# Patient Record
Sex: Male | Born: 1970 | Race: White | Hispanic: Refuse to answer | Smoking: Never smoker
Health system: Southern US, Community
[De-identification: ages and names within clinical notes are randomized; demographics above are authoritative.]

## PROBLEM LIST (undated history)

## (undated) DIAGNOSIS — E785 Hyperlipidemia, unspecified: Secondary | ICD-10-CM

## (undated) DIAGNOSIS — I10 Essential (primary) hypertension: Secondary | ICD-10-CM

## (undated) HISTORY — DX: Hyperlipidemia, unspecified: E78.5

## (undated) HISTORY — PX: NASAL SEPTUM SURGERY: SHX37

## (undated) HISTORY — DX: Essential (primary) hypertension: I10

---

## 2004-04-20 ENCOUNTER — Emergency Department: Payer: Self-pay | Admitting: Emergency Medicine

## 2012-08-16 ENCOUNTER — Emergency Department: Payer: Self-pay | Admitting: Emergency Medicine

## 2012-08-16 LAB — URINALYSIS, COMPLETE
Blood: NEGATIVE
Glucose,UR: NEGATIVE mg/dL (ref 0–75)
Nitrite: NEGATIVE
Ph: 5 (ref 4.5–8.0)
Protein: NEGATIVE
WBC UR: 2 /HPF (ref 0–5)

## 2012-08-16 LAB — CBC WITH DIFFERENTIAL/PLATELET
Basophil #: 0.1 10*3/uL (ref 0.0–0.1)
Basophil %: 0.9 %
Eosinophil %: 1.1 %
Lymphocyte #: 4.4 10*3/uL — ABNORMAL HIGH (ref 1.0–3.6)
Lymphocyte %: 40.6 %
MCHC: 32.8 g/dL (ref 32.0–36.0)
Monocyte #: 0.6 x10 3/mm (ref 0.2–1.0)
Monocyte %: 5.3 %
Neutrophil #: 5.7 10*3/uL (ref 1.4–6.5)
Platelet: 268 10*3/uL (ref 150–440)
RBC: 5.45 10*6/uL (ref 4.40–5.90)
WBC: 10.9 10*3/uL — ABNORMAL HIGH (ref 3.8–10.6)

## 2012-08-16 LAB — COMPREHENSIVE METABOLIC PANEL
Albumin: 4.1 g/dL (ref 3.4–5.0)
Alkaline Phosphatase: 88 U/L (ref 50–136)
Anion Gap: 15 (ref 7–16)
BUN: 15 mg/dL (ref 7–18)
Bilirubin,Total: 0.4 mg/dL (ref 0.2–1.0)
Chloride: 101 mmol/L (ref 98–107)
Creatinine: 1.24 mg/dL (ref 0.60–1.30)
EGFR (African American): >60
Potassium: 3.3 mmol/L — ABNORMAL LOW (ref 3.5–5.1)
SGOT(AST): 31 U/L (ref 15–37)
SGPT (ALT): 38 U/L (ref 12–78)
Sodium: 137 mmol/L (ref 136–145)
Total Protein: 8.1 g/dL (ref 6.4–8.2)

## 2012-08-16 LAB — DRUG SCREEN, URINE
Amphetamines, Ur Screen: NEGATIVE (ref ?–1000)
Barbiturates, Ur Screen: NEGATIVE (ref ?–200)
Cocaine Metabolite,Ur ~~LOC~~: NEGATIVE (ref ?–300)
Methadone, Ur Screen: NEGATIVE (ref ?–300)
Opiate, Ur Screen: NEGATIVE (ref ?–300)
Phencyclidine (PCP) Ur S: NEGATIVE (ref ?–25)
Tricyclic, Ur Screen: NEGATIVE (ref ?–1000)

## 2012-08-16 LAB — TSH: Thyroid Stimulating Horm: 1.95 u[IU]/mL

## 2012-08-16 LAB — MAGNESIUM: Magnesium: 2.1 mg/dL

## 2014-01-28 ENCOUNTER — Other Ambulatory Visit: Payer: Self-pay | Admitting: Family Medicine

## 2014-01-28 DIAGNOSIS — R110 Nausea: Secondary | ICD-10-CM

## 2014-01-28 DIAGNOSIS — R41840 Attention and concentration deficit: Secondary | ICD-10-CM

## 2014-02-08 ENCOUNTER — Other Ambulatory Visit: Payer: Self-pay

## 2014-02-15 ENCOUNTER — Ambulatory Visit
Admission: RE | Admit: 2014-02-15 | Discharge: 2014-02-15 | Disposition: A | Discharge status: Home / Self Care | Payer: BC Managed Care – PPO | Source: Ambulatory Visit | Attending: Family Medicine | Admitting: Family Medicine

## 2014-02-15 DIAGNOSIS — R110 Nausea: Secondary | ICD-10-CM

## 2014-02-15 DIAGNOSIS — R41840 Attention and concentration deficit: Secondary | ICD-10-CM

## 2015-10-31 DIAGNOSIS — E559 Vitamin D deficiency, unspecified: Secondary | ICD-10-CM | POA: Diagnosis not present

## 2015-10-31 DIAGNOSIS — E785 Hyperlipidemia, unspecified: Secondary | ICD-10-CM | POA: Diagnosis not present

## 2015-10-31 DIAGNOSIS — M719 Bursopathy, unspecified: Secondary | ICD-10-CM | POA: Diagnosis not present

## 2015-10-31 DIAGNOSIS — I10 Essential (primary) hypertension: Secondary | ICD-10-CM | POA: Diagnosis not present

## 2016-01-03 DIAGNOSIS — M7061 Trochanteric bursitis, right hip: Secondary | ICD-10-CM | POA: Diagnosis not present

## 2016-01-03 DIAGNOSIS — I10 Essential (primary) hypertension: Secondary | ICD-10-CM | POA: Diagnosis not present

## 2016-01-03 DIAGNOSIS — L649 Androgenic alopecia, unspecified: Secondary | ICD-10-CM | POA: Diagnosis not present

## 2016-01-03 DIAGNOSIS — G4700 Insomnia, unspecified: Secondary | ICD-10-CM | POA: Diagnosis not present

## 2016-02-06 DIAGNOSIS — E559 Vitamin D deficiency, unspecified: Secondary | ICD-10-CM | POA: Diagnosis not present

## 2016-02-06 DIAGNOSIS — I10 Essential (primary) hypertension: Secondary | ICD-10-CM | POA: Diagnosis not present

## 2016-02-06 DIAGNOSIS — E785 Hyperlipidemia, unspecified: Secondary | ICD-10-CM | POA: Diagnosis not present

## 2016-02-06 DIAGNOSIS — M719 Bursopathy, unspecified: Secondary | ICD-10-CM | POA: Diagnosis not present

## 2016-05-09 DIAGNOSIS — E559 Vitamin D deficiency, unspecified: Secondary | ICD-10-CM | POA: Diagnosis not present

## 2016-05-09 DIAGNOSIS — E785 Hyperlipidemia, unspecified: Secondary | ICD-10-CM | POA: Diagnosis not present

## 2016-05-09 DIAGNOSIS — I10 Essential (primary) hypertension: Secondary | ICD-10-CM | POA: Diagnosis not present

## 2016-08-08 DIAGNOSIS — E559 Vitamin D deficiency, unspecified: Secondary | ICD-10-CM | POA: Diagnosis not present

## 2016-08-08 DIAGNOSIS — I10 Essential (primary) hypertension: Secondary | ICD-10-CM | POA: Diagnosis not present

## 2016-08-08 DIAGNOSIS — E785 Hyperlipidemia, unspecified: Secondary | ICD-10-CM | POA: Diagnosis not present

## 2016-08-15 DIAGNOSIS — E785 Hyperlipidemia, unspecified: Secondary | ICD-10-CM | POA: Diagnosis not present

## 2016-08-15 DIAGNOSIS — I10 Essential (primary) hypertension: Secondary | ICD-10-CM | POA: Diagnosis not present

## 2016-10-03 DIAGNOSIS — I10 Essential (primary) hypertension: Secondary | ICD-10-CM | POA: Diagnosis not present

## 2016-10-03 DIAGNOSIS — E7800 Pure hypercholesterolemia, unspecified: Secondary | ICD-10-CM | POA: Diagnosis not present

## 2016-10-03 DIAGNOSIS — M7061 Trochanteric bursitis, right hip: Secondary | ICD-10-CM | POA: Diagnosis not present

## 2016-10-03 DIAGNOSIS — L649 Androgenic alopecia, unspecified: Secondary | ICD-10-CM | POA: Diagnosis not present

## 2016-10-31 DIAGNOSIS — E785 Hyperlipidemia, unspecified: Secondary | ICD-10-CM | POA: Diagnosis not present

## 2016-10-31 DIAGNOSIS — I10 Essential (primary) hypertension: Secondary | ICD-10-CM | POA: Diagnosis not present

## 2016-11-26 DIAGNOSIS — B349 Viral infection, unspecified: Secondary | ICD-10-CM | POA: Diagnosis not present

## 2016-12-17 DIAGNOSIS — E785 Hyperlipidemia, unspecified: Secondary | ICD-10-CM | POA: Diagnosis not present

## 2016-12-24 DIAGNOSIS — I10 Essential (primary) hypertension: Secondary | ICD-10-CM | POA: Diagnosis not present

## 2016-12-24 DIAGNOSIS — E785 Hyperlipidemia, unspecified: Secondary | ICD-10-CM | POA: Diagnosis not present

## 2017-05-15 DIAGNOSIS — E559 Vitamin D deficiency, unspecified: Secondary | ICD-10-CM | POA: Diagnosis not present

## 2017-05-15 DIAGNOSIS — Z008 Encounter for other general examination: Secondary | ICD-10-CM | POA: Diagnosis not present

## 2017-05-15 DIAGNOSIS — I10 Essential (primary) hypertension: Secondary | ICD-10-CM | POA: Diagnosis not present

## 2017-05-15 DIAGNOSIS — E785 Hyperlipidemia, unspecified: Secondary | ICD-10-CM | POA: Diagnosis not present

## 2017-06-10 DIAGNOSIS — G4700 Insomnia, unspecified: Secondary | ICD-10-CM | POA: Diagnosis not present

## 2017-06-10 DIAGNOSIS — I10 Essential (primary) hypertension: Secondary | ICD-10-CM | POA: Diagnosis not present

## 2017-06-10 DIAGNOSIS — E7800 Pure hypercholesterolemia, unspecified: Secondary | ICD-10-CM | POA: Diagnosis not present

## 2017-06-10 DIAGNOSIS — L649 Androgenic alopecia, unspecified: Secondary | ICD-10-CM | POA: Diagnosis not present

## 2017-11-07 DIAGNOSIS — L089 Local infection of the skin and subcutaneous tissue, unspecified: Secondary | ICD-10-CM | POA: Diagnosis not present

## 2017-11-11 DIAGNOSIS — Z008 Encounter for other general examination: Secondary | ICD-10-CM | POA: Diagnosis not present

## 2017-11-11 DIAGNOSIS — I10 Essential (primary) hypertension: Secondary | ICD-10-CM | POA: Diagnosis not present

## 2017-11-11 DIAGNOSIS — R7309 Other abnormal glucose: Secondary | ICD-10-CM | POA: Diagnosis not present

## 2017-11-11 DIAGNOSIS — E559 Vitamin D deficiency, unspecified: Secondary | ICD-10-CM | POA: Diagnosis not present

## 2017-11-11 DIAGNOSIS — M719 Bursopathy, unspecified: Secondary | ICD-10-CM | POA: Diagnosis not present

## 2017-11-11 DIAGNOSIS — L0293 Carbuncle, unspecified: Secondary | ICD-10-CM | POA: Diagnosis not present

## 2017-11-11 DIAGNOSIS — E785 Hyperlipidemia, unspecified: Secondary | ICD-10-CM | POA: Diagnosis not present

## 2018-01-31 DIAGNOSIS — G4700 Insomnia, unspecified: Secondary | ICD-10-CM | POA: Diagnosis not present

## 2018-01-31 DIAGNOSIS — M7061 Trochanteric bursitis, right hip: Secondary | ICD-10-CM | POA: Diagnosis not present

## 2018-01-31 DIAGNOSIS — L649 Androgenic alopecia, unspecified: Secondary | ICD-10-CM | POA: Diagnosis not present

## 2018-01-31 DIAGNOSIS — Z6828 Body mass index (BMI) 28.0-28.9, adult: Secondary | ICD-10-CM | POA: Diagnosis not present

## 2018-02-17 DIAGNOSIS — L709 Acne, unspecified: Secondary | ICD-10-CM | POA: Diagnosis not present

## 2018-03-03 DIAGNOSIS — M9901 Segmental and somatic dysfunction of cervical region: Secondary | ICD-10-CM | POA: Diagnosis not present

## 2018-03-03 DIAGNOSIS — M531 Cervicobrachial syndrome: Secondary | ICD-10-CM | POA: Diagnosis not present

## 2018-03-03 DIAGNOSIS — M9902 Segmental and somatic dysfunction of thoracic region: Secondary | ICD-10-CM | POA: Diagnosis not present

## 2018-03-03 DIAGNOSIS — M5022 Other cervical disc displacement, mid-cervical region, unspecified level: Secondary | ICD-10-CM | POA: Diagnosis not present

## 2018-03-03 DIAGNOSIS — M62838 Other muscle spasm: Secondary | ICD-10-CM | POA: Diagnosis not present

## 2018-03-10 DIAGNOSIS — Z23 Encounter for immunization: Secondary | ICD-10-CM | POA: Diagnosis not present

## 2018-03-21 DIAGNOSIS — M9902 Segmental and somatic dysfunction of thoracic region: Secondary | ICD-10-CM | POA: Diagnosis not present

## 2018-03-21 DIAGNOSIS — M531 Cervicobrachial syndrome: Secondary | ICD-10-CM | POA: Diagnosis not present

## 2018-03-21 DIAGNOSIS — M9901 Segmental and somatic dysfunction of cervical region: Secondary | ICD-10-CM | POA: Diagnosis not present

## 2018-03-21 DIAGNOSIS — M5022 Other cervical disc displacement, mid-cervical region, unspecified level: Secondary | ICD-10-CM | POA: Diagnosis not present

## 2018-03-29 DIAGNOSIS — M5022 Other cervical disc displacement, mid-cervical region, unspecified level: Secondary | ICD-10-CM | POA: Diagnosis not present

## 2018-03-29 DIAGNOSIS — M531 Cervicobrachial syndrome: Secondary | ICD-10-CM | POA: Diagnosis not present

## 2018-03-29 DIAGNOSIS — M9901 Segmental and somatic dysfunction of cervical region: Secondary | ICD-10-CM | POA: Diagnosis not present

## 2018-03-29 DIAGNOSIS — M9902 Segmental and somatic dysfunction of thoracic region: Secondary | ICD-10-CM | POA: Diagnosis not present

## 2018-04-28 DIAGNOSIS — E785 Hyperlipidemia, unspecified: Secondary | ICD-10-CM | POA: Diagnosis not present

## 2018-04-28 DIAGNOSIS — Z008 Encounter for other general examination: Secondary | ICD-10-CM | POA: Diagnosis not present

## 2018-04-28 DIAGNOSIS — I10 Essential (primary) hypertension: Secondary | ICD-10-CM | POA: Diagnosis not present

## 2018-05-09 DIAGNOSIS — M5022 Other cervical disc displacement, mid-cervical region, unspecified level: Secondary | ICD-10-CM | POA: Diagnosis not present

## 2018-05-09 DIAGNOSIS — M531 Cervicobrachial syndrome: Secondary | ICD-10-CM | POA: Diagnosis not present

## 2018-05-09 DIAGNOSIS — M9901 Segmental and somatic dysfunction of cervical region: Secondary | ICD-10-CM | POA: Diagnosis not present

## 2018-05-09 DIAGNOSIS — M9902 Segmental and somatic dysfunction of thoracic region: Secondary | ICD-10-CM | POA: Diagnosis not present

## 2018-05-24 DIAGNOSIS — M5022 Other cervical disc displacement, mid-cervical region, unspecified level: Secondary | ICD-10-CM | POA: Diagnosis not present

## 2018-05-24 DIAGNOSIS — M9901 Segmental and somatic dysfunction of cervical region: Secondary | ICD-10-CM | POA: Diagnosis not present

## 2018-05-24 DIAGNOSIS — M9902 Segmental and somatic dysfunction of thoracic region: Secondary | ICD-10-CM | POA: Diagnosis not present

## 2018-05-24 DIAGNOSIS — M531 Cervicobrachial syndrome: Secondary | ICD-10-CM | POA: Diagnosis not present

## 2018-06-13 DIAGNOSIS — M9902 Segmental and somatic dysfunction of thoracic region: Secondary | ICD-10-CM | POA: Diagnosis not present

## 2018-06-13 DIAGNOSIS — M9901 Segmental and somatic dysfunction of cervical region: Secondary | ICD-10-CM | POA: Diagnosis not present

## 2018-06-13 DIAGNOSIS — M531 Cervicobrachial syndrome: Secondary | ICD-10-CM | POA: Diagnosis not present

## 2018-06-13 DIAGNOSIS — M5022 Other cervical disc displacement, mid-cervical region, unspecified level: Secondary | ICD-10-CM | POA: Diagnosis not present

## 2018-06-20 DIAGNOSIS — M5022 Other cervical disc displacement, mid-cervical region, unspecified level: Secondary | ICD-10-CM | POA: Diagnosis not present

## 2018-06-20 DIAGNOSIS — M531 Cervicobrachial syndrome: Secondary | ICD-10-CM | POA: Diagnosis not present

## 2018-06-20 DIAGNOSIS — M9902 Segmental and somatic dysfunction of thoracic region: Secondary | ICD-10-CM | POA: Diagnosis not present

## 2018-06-20 DIAGNOSIS — M9901 Segmental and somatic dysfunction of cervical region: Secondary | ICD-10-CM | POA: Diagnosis not present

## 2018-08-11 DIAGNOSIS — E559 Vitamin D deficiency, unspecified: Secondary | ICD-10-CM | POA: Diagnosis not present

## 2018-08-11 DIAGNOSIS — F4322 Adjustment disorder with anxiety: Secondary | ICD-10-CM | POA: Diagnosis not present

## 2018-08-11 DIAGNOSIS — L649 Androgenic alopecia, unspecified: Secondary | ICD-10-CM | POA: Diagnosis not present

## 2018-08-11 DIAGNOSIS — F410 Panic disorder [episodic paroxysmal anxiety] without agoraphobia: Secondary | ICD-10-CM | POA: Diagnosis not present

## 2018-09-22 DIAGNOSIS — M5022 Other cervical disc displacement, mid-cervical region, unspecified level: Secondary | ICD-10-CM | POA: Diagnosis not present

## 2018-09-22 DIAGNOSIS — M9901 Segmental and somatic dysfunction of cervical region: Secondary | ICD-10-CM | POA: Diagnosis not present

## 2018-09-22 DIAGNOSIS — M531 Cervicobrachial syndrome: Secondary | ICD-10-CM | POA: Diagnosis not present

## 2018-09-22 DIAGNOSIS — M9902 Segmental and somatic dysfunction of thoracic region: Secondary | ICD-10-CM | POA: Diagnosis not present

## 2018-09-24 DIAGNOSIS — M531 Cervicobrachial syndrome: Secondary | ICD-10-CM | POA: Diagnosis not present

## 2018-09-24 DIAGNOSIS — M5022 Other cervical disc displacement, mid-cervical region, unspecified level: Secondary | ICD-10-CM | POA: Diagnosis not present

## 2018-09-24 DIAGNOSIS — M9902 Segmental and somatic dysfunction of thoracic region: Secondary | ICD-10-CM | POA: Diagnosis not present

## 2018-09-24 DIAGNOSIS — M9901 Segmental and somatic dysfunction of cervical region: Secondary | ICD-10-CM | POA: Diagnosis not present

## 2018-09-30 DIAGNOSIS — M531 Cervicobrachial syndrome: Secondary | ICD-10-CM | POA: Diagnosis not present

## 2018-09-30 DIAGNOSIS — M9902 Segmental and somatic dysfunction of thoracic region: Secondary | ICD-10-CM | POA: Diagnosis not present

## 2018-09-30 DIAGNOSIS — M5022 Other cervical disc displacement, mid-cervical region, unspecified level: Secondary | ICD-10-CM | POA: Diagnosis not present

## 2018-09-30 DIAGNOSIS — M9901 Segmental and somatic dysfunction of cervical region: Secondary | ICD-10-CM | POA: Diagnosis not present

## 2018-10-07 DIAGNOSIS — M9901 Segmental and somatic dysfunction of cervical region: Secondary | ICD-10-CM | POA: Diagnosis not present

## 2018-10-07 DIAGNOSIS — R51 Headache: Secondary | ICD-10-CM | POA: Diagnosis not present

## 2018-10-07 DIAGNOSIS — M25511 Pain in right shoulder: Secondary | ICD-10-CM | POA: Diagnosis not present

## 2018-10-07 DIAGNOSIS — M9902 Segmental and somatic dysfunction of thoracic region: Secondary | ICD-10-CM | POA: Diagnosis not present

## 2018-10-10 DIAGNOSIS — L649 Androgenic alopecia, unspecified: Secondary | ICD-10-CM | POA: Diagnosis not present

## 2018-10-10 DIAGNOSIS — G4700 Insomnia, unspecified: Secondary | ICD-10-CM | POA: Diagnosis not present

## 2018-10-14 DIAGNOSIS — M9902 Segmental and somatic dysfunction of thoracic region: Secondary | ICD-10-CM | POA: Diagnosis not present

## 2018-10-14 DIAGNOSIS — M25511 Pain in right shoulder: Secondary | ICD-10-CM | POA: Diagnosis not present

## 2018-10-14 DIAGNOSIS — M9901 Segmental and somatic dysfunction of cervical region: Secondary | ICD-10-CM | POA: Diagnosis not present

## 2018-10-14 DIAGNOSIS — R51 Headache: Secondary | ICD-10-CM | POA: Diagnosis not present

## 2018-10-28 DIAGNOSIS — R51 Headache: Secondary | ICD-10-CM | POA: Diagnosis not present

## 2018-10-28 DIAGNOSIS — M9901 Segmental and somatic dysfunction of cervical region: Secondary | ICD-10-CM | POA: Diagnosis not present

## 2018-10-28 DIAGNOSIS — M25511 Pain in right shoulder: Secondary | ICD-10-CM | POA: Diagnosis not present

## 2018-10-28 DIAGNOSIS — M9902 Segmental and somatic dysfunction of thoracic region: Secondary | ICD-10-CM | POA: Diagnosis not present

## 2018-12-03 DIAGNOSIS — M9901 Segmental and somatic dysfunction of cervical region: Secondary | ICD-10-CM | POA: Diagnosis not present

## 2018-12-03 DIAGNOSIS — M9902 Segmental and somatic dysfunction of thoracic region: Secondary | ICD-10-CM | POA: Diagnosis not present

## 2018-12-03 DIAGNOSIS — M25511 Pain in right shoulder: Secondary | ICD-10-CM | POA: Diagnosis not present

## 2018-12-03 DIAGNOSIS — R51 Headache: Secondary | ICD-10-CM | POA: Diagnosis not present

## 2018-12-15 DIAGNOSIS — I10 Essential (primary) hypertension: Secondary | ICD-10-CM | POA: Diagnosis not present

## 2018-12-15 DIAGNOSIS — E559 Vitamin D deficiency, unspecified: Secondary | ICD-10-CM | POA: Diagnosis not present

## 2018-12-15 DIAGNOSIS — E785 Hyperlipidemia, unspecified: Secondary | ICD-10-CM | POA: Diagnosis not present

## 2018-12-20 DIAGNOSIS — M9901 Segmental and somatic dysfunction of cervical region: Secondary | ICD-10-CM | POA: Diagnosis not present

## 2018-12-20 DIAGNOSIS — M9902 Segmental and somatic dysfunction of thoracic region: Secondary | ICD-10-CM | POA: Diagnosis not present

## 2018-12-20 DIAGNOSIS — M25511 Pain in right shoulder: Secondary | ICD-10-CM | POA: Diagnosis not present

## 2018-12-20 DIAGNOSIS — R51 Headache: Secondary | ICD-10-CM | POA: Diagnosis not present

## 2018-12-26 DIAGNOSIS — L03211 Cellulitis of face: Secondary | ICD-10-CM | POA: Diagnosis not present

## 2018-12-26 DIAGNOSIS — L0201 Cutaneous abscess of face: Secondary | ICD-10-CM | POA: Diagnosis not present

## 2019-01-12 DIAGNOSIS — L0293 Carbuncle, unspecified: Secondary | ICD-10-CM | POA: Diagnosis not present

## 2019-01-22 DIAGNOSIS — R51 Headache: Secondary | ICD-10-CM | POA: Diagnosis not present

## 2019-01-22 DIAGNOSIS — M9901 Segmental and somatic dysfunction of cervical region: Secondary | ICD-10-CM | POA: Diagnosis not present

## 2019-01-22 DIAGNOSIS — M9902 Segmental and somatic dysfunction of thoracic region: Secondary | ICD-10-CM | POA: Diagnosis not present

## 2019-01-22 DIAGNOSIS — M25511 Pain in right shoulder: Secondary | ICD-10-CM | POA: Diagnosis not present

## 2019-01-29 DIAGNOSIS — M9902 Segmental and somatic dysfunction of thoracic region: Secondary | ICD-10-CM | POA: Diagnosis not present

## 2019-01-29 DIAGNOSIS — M9901 Segmental and somatic dysfunction of cervical region: Secondary | ICD-10-CM | POA: Diagnosis not present

## 2019-01-29 DIAGNOSIS — R51 Headache: Secondary | ICD-10-CM | POA: Diagnosis not present

## 2019-01-29 DIAGNOSIS — M25511 Pain in right shoulder: Secondary | ICD-10-CM | POA: Diagnosis not present

## 2019-02-06 DIAGNOSIS — M25511 Pain in right shoulder: Secondary | ICD-10-CM | POA: Diagnosis not present

## 2019-02-06 DIAGNOSIS — M9902 Segmental and somatic dysfunction of thoracic region: Secondary | ICD-10-CM | POA: Diagnosis not present

## 2019-02-06 DIAGNOSIS — M9901 Segmental and somatic dysfunction of cervical region: Secondary | ICD-10-CM | POA: Diagnosis not present

## 2019-02-06 DIAGNOSIS — R51 Headache: Secondary | ICD-10-CM | POA: Diagnosis not present

## 2019-02-21 DIAGNOSIS — M9901 Segmental and somatic dysfunction of cervical region: Secondary | ICD-10-CM | POA: Diagnosis not present

## 2019-02-21 DIAGNOSIS — M9902 Segmental and somatic dysfunction of thoracic region: Secondary | ICD-10-CM | POA: Diagnosis not present

## 2019-02-21 DIAGNOSIS — M25511 Pain in right shoulder: Secondary | ICD-10-CM | POA: Diagnosis not present

## 2019-02-21 DIAGNOSIS — R51 Headache: Secondary | ICD-10-CM | POA: Diagnosis not present

## 2019-03-09 DIAGNOSIS — Z23 Encounter for immunization: Secondary | ICD-10-CM | POA: Diagnosis not present

## 2019-04-18 DIAGNOSIS — M9902 Segmental and somatic dysfunction of thoracic region: Secondary | ICD-10-CM | POA: Diagnosis not present

## 2019-04-18 DIAGNOSIS — R519 Headache, unspecified: Secondary | ICD-10-CM | POA: Diagnosis not present

## 2019-04-18 DIAGNOSIS — M25511 Pain in right shoulder: Secondary | ICD-10-CM | POA: Diagnosis not present

## 2019-04-18 DIAGNOSIS — M9901 Segmental and somatic dysfunction of cervical region: Secondary | ICD-10-CM | POA: Diagnosis not present

## 2019-05-20 DIAGNOSIS — M9901 Segmental and somatic dysfunction of cervical region: Secondary | ICD-10-CM | POA: Diagnosis not present

## 2019-05-20 DIAGNOSIS — R519 Headache, unspecified: Secondary | ICD-10-CM | POA: Diagnosis not present

## 2019-05-20 DIAGNOSIS — M25511 Pain in right shoulder: Secondary | ICD-10-CM | POA: Diagnosis not present

## 2019-05-20 DIAGNOSIS — M9902 Segmental and somatic dysfunction of thoracic region: Secondary | ICD-10-CM | POA: Diagnosis not present

## 2019-07-25 DIAGNOSIS — R519 Headache, unspecified: Secondary | ICD-10-CM | POA: Diagnosis not present

## 2019-07-25 DIAGNOSIS — M25511 Pain in right shoulder: Secondary | ICD-10-CM | POA: Diagnosis not present

## 2019-07-25 DIAGNOSIS — M9902 Segmental and somatic dysfunction of thoracic region: Secondary | ICD-10-CM | POA: Diagnosis not present

## 2019-07-25 DIAGNOSIS — M9901 Segmental and somatic dysfunction of cervical region: Secondary | ICD-10-CM | POA: Diagnosis not present

## 2019-09-03 DIAGNOSIS — M9901 Segmental and somatic dysfunction of cervical region: Secondary | ICD-10-CM | POA: Diagnosis not present

## 2019-09-03 DIAGNOSIS — M9902 Segmental and somatic dysfunction of thoracic region: Secondary | ICD-10-CM | POA: Diagnosis not present

## 2019-09-03 DIAGNOSIS — M5386 Other specified dorsopathies, lumbar region: Secondary | ICD-10-CM | POA: Diagnosis not present

## 2019-09-03 DIAGNOSIS — R519 Headache, unspecified: Secondary | ICD-10-CM | POA: Diagnosis not present

## 2019-10-01 ENCOUNTER — Other Ambulatory Visit: Payer: Self-pay

## 2019-10-01 ENCOUNTER — Emergency Department (HOSPITAL_COMMUNITY)
Admission: EM | Admit: 2019-10-01 | Discharge: 2019-10-02 | Disposition: A | Discharge status: Home / Self Care | Payer: BC Managed Care – PPO | Attending: Emergency Medicine | Admitting: Emergency Medicine

## 2019-10-01 ENCOUNTER — Emergency Department (HOSPITAL_COMMUNITY): Payer: BC Managed Care – PPO

## 2019-10-01 ENCOUNTER — Encounter (HOSPITAL_COMMUNITY): Payer: Self-pay | Admitting: Emergency Medicine

## 2019-10-01 DIAGNOSIS — R030 Elevated blood-pressure reading, without diagnosis of hypertension: Secondary | ICD-10-CM

## 2019-10-01 DIAGNOSIS — R0602 Shortness of breath: Secondary | ICD-10-CM | POA: Diagnosis not present

## 2019-10-01 DIAGNOSIS — R109 Unspecified abdominal pain: Secondary | ICD-10-CM | POA: Diagnosis not present

## 2019-10-01 DIAGNOSIS — I10 Essential (primary) hypertension: Secondary | ICD-10-CM | POA: Diagnosis not present

## 2019-10-01 DIAGNOSIS — R7401 Elevation of levels of liver transaminase levels: Secondary | ICD-10-CM

## 2019-10-01 LAB — CBC
HCT: 52.4 % — ABNORMAL HIGH (ref 39.0–52.0)
Hemoglobin: 16.5 g/dL (ref 13.0–17.0)
MCH: 26.3 pg (ref 26.0–34.0)
MCHC: 31.5 g/dL (ref 30.0–36.0)
MCV: 83.4 fL (ref 80.0–100.0)
Platelets: 214 10*3/uL (ref 150–400)
RBC: 6.28 MIL/uL — ABNORMAL HIGH (ref 4.22–5.81)
RDW: 13.7 % (ref 11.5–15.5)
WBC: 8.1 10*3/uL (ref 4.0–10.5)
nRBC: 0 % (ref 0.0–0.2)

## 2019-10-01 LAB — COMPREHENSIVE METABOLIC PANEL
ALT: 82 U/L — ABNORMAL HIGH (ref 0–44)
AST: 38 U/L (ref 15–41)
Albumin: 4.3 g/dL (ref 3.5–5.0)
Alkaline Phosphatase: 93 U/L (ref 38–126)
Anion gap: 12 (ref 5–15)
BUN: 19 mg/dL (ref 6–20)
CO2: 30 mmol/L (ref 22–32)
Calcium: 9.8 mg/dL (ref 8.9–10.3)
Chloride: 97 mmol/L — ABNORMAL LOW (ref 98–111)
Creatinine, Ser: 1.11 mg/dL (ref 0.61–1.24)
GFR calc Af Amer: >60 mL/min (ref >60)
GFR calc non Af Amer: >60 mL/min (ref >60)
Glucose, Bld: 96 mg/dL (ref 70–99)
Potassium: 4 mmol/L (ref 3.5–5.1)
Sodium: 139 mmol/L (ref 135–145)
Total Bilirubin: 0.5 mg/dL (ref 0.3–1.2)
Total Protein: 7.7 g/dL (ref 6.5–8.1)

## 2019-10-01 LAB — LIPASE, BLOOD: Lipase: 27 U/L (ref 11–51)

## 2019-10-01 MED ORDER — SODIUM CHLORIDE 0.9% FLUSH: 3.0000 mL | Freq: Once | INTRAVENOUS | Status: DC

## 2019-10-01 NOTE — ED Triage Notes (Addendum)
Pt sent by PCP for evaluation of shortness of breath on exertion and nausea/vomiting x 2 days. Pt denies chest pain.

## 2019-10-02 DIAGNOSIS — R0602 Shortness of breath: Secondary | ICD-10-CM | POA: Diagnosis not present

## 2019-10-02 DIAGNOSIS — R030 Elevated blood-pressure reading, without diagnosis of hypertension: Secondary | ICD-10-CM | POA: Diagnosis not present

## 2019-10-02 LAB — TROPONIN I (HIGH SENSITIVITY): Troponin I (High Sensitivity): 5 ng/L (ref <18)

## 2019-10-02 LAB — BRAIN NATRIURETIC PEPTIDE: B Natriuretic Peptide: 15 pg/mL (ref 0.0–100.0)

## 2019-10-02 LAB — D-DIMER, QUANTITATIVE: D-Dimer, Quant: <0.27 ug/mL-FEU (ref 0.00–0.50)

## 2019-10-02 MED ORDER — ALBUTEROL SULFATE HFA 108 (90 BASE) MCG/ACT IN AERS
4.0000 | INHALATION_SPRAY | Freq: Once | RESPIRATORY_TRACT | Status: AC
  Administered 2019-10-02: 4 via RESPIRATORY_TRACT
  Filled 2019-10-02: qty 6.7

## 2019-10-02 MED ORDER — PREDNISONE 50 MG PO TABS
50.0000 mg | ORAL_TABLET | Freq: Every day | ORAL | 0 refills | Status: DC
Start: 2019-10-02 — End: 2019-10-09

## 2019-10-02 MED ORDER — PREDNISONE 20 MG PO TABS
60.0000 mg | ORAL_TABLET | Freq: Once | ORAL | Status: AC
  Administered 2019-10-02: 60 mg via ORAL
  Filled 2019-10-02: qty 3

## 2019-10-02 NOTE — Discharge Instructions (Signed)
Your evaluation today did not show the reason you are having difficulty breathing.  There was no sign of pneumonia, heart failure, blood clots in the lung.  Please follow-up with the lung specialist for further evaluation, return to the emergency department if symptoms are worsening.  Please monitor your blood pressure at home.  It was elevated today.  If it stays elevated, you may need to start taking medication to control it.

## 2019-10-02 NOTE — ED Provider Notes (Signed)
[facility] EMERGENCY DEPARTMENT Provider Note   CSN: [id number] Arrival date & time: 10/01/19  1704   History Chief Complaint  Patient presents with  . Shortness of Breath  . Abdominal Pain    Carlos Carpenter is a 49 y.o. male.  The history is provided by the patient.  Shortness of Breath Associated symptoms: abdominal pain   Abdominal Pain Associated symptoms: shortness of breath   He has no significant past history, family sent care by his primary care provider because of new onset dyspnea.  He was in his usual state of health until 6 days ago when he had episode of nausea and vomiting.  He felt better the next 2 days, then had recurrence of nausea without vomiting.  Yesterday, he started noticing dyspnea which is worse with exertion and worse with laying flat.  He denies cough, fever, chills but has had some sweats.  He denies any sick contacts and has completed the coronavirus vaccine series.  History reviewed. No pertinent past medical history.  There are no problems to display for this patient.   History reviewed. No pertinent surgical history.     No family history on file.  Social History   Tobacco Use  . Smoking status: Not on file  Substance Use Topics  . Alcohol use: Not on file  . Drug use: Not on file    Home Medications Prior to Admission medications   Not on File    Allergies    Patient has no known allergies.  Review of Systems   Review of Systems  Respiratory: Positive for shortness of breath.   Gastrointestinal: Positive for abdominal pain.  All other systems reviewed and are negative.   Physical Exam Updated Vital Signs BP (!) 135/95   Pulse 70   Temp (!) 97.5 F (36.4 C)   Resp 17   SpO2 95%   Physical Exam Vitals and nursing note reviewed.   49 year old male, resting comfortably and in no acute distress. Vital signs are significant for elevated blood pressure. Oxygen saturation is 95%, which is normal. Head  is normocephalic and atraumatic. PERRLA, EOMI. Oropharynx is clear. Neck is nontender and supple without adenopathy or JVD. Back is nontender and there is no CVA tenderness. Lungs are clear without rales, wheezes, or rhonchi.  Slightly prolonged exhalation phase is noted. Chest is nontender. Heart has regular rate and rhythm without murmur. Abdomen is soft, flat, nontender without masses or hepatosplenomegaly and peristalsis is normoactive. Extremities have no cyanosis or edema, full range of motion is present. Skin is warm and dry without rash. Neurologic: Mental status is normal, cranial nerves are intact, there are no motor or sensory deficits.  ED Results / Procedures / Treatments   Labs (all labs ordered are listed, but only abnormal results are displayed) Labs Reviewed  COMPREHENSIVE METABOLIC PANEL - Abnormal; Notable for the following components:      Result Value   Chloride 97 (*)    ALT 82 (*)    All other components within normal limits  CBC - Abnormal; Notable for the following components:   RBC 6.28 (*)    HCT 52.4 (*)    All other components within normal limits  LIPASE, BLOOD  D-DIMER, QUANTITATIVE (NOT AT [facility])  BRAIN NATRIURETIC PEPTIDE  URINALYSIS, ROUTINE W REFLEX MICROSCOPIC  TROPONIN I (HIGH SENSITIVITY)    EKG EKG Interpretation  Date/Time:  Thursday Oct 01 2019 17:09:38 EDT Ventricular Rate:  81 PR Interval:  148 QRS Duration: 96 QT Interval:  358 QTC Calculation: 415 R Axis:   18 Text Interpretation: Normal sinus rhythm Septal infarct , age undetermined Abnormal ECG When compared with ECG of 08/16/2012, HEART RATE has decreased Confirmed by Delora Fuel ([id number]) on 10/01/2019 11:27:46 PM   Radiology DG Chest 2 View  Result Date: 10/01/2019 CLINICAL DATA:  Shortness of breath on exertion. Nausea and vomiting for the past 2 days. EXAM: CHEST - 2 VIEW COMPARISON:  None. FINDINGS: Normal sized heart. Clear lungs with normal vascularity. Mild diffuse  peribronchial thickening. Mild thoracic spine degenerative changes. IMPRESSION: Mild bronchitic changes. Electronically Signed   By: Claudie Revering M.D.   On: 10/01/2019 18:54    Procedures Procedures   Medications Ordered in ED Medications  sodium chloride flush (NS) 0.9 % injection 3 mL (has no administration in time range)  albuterol (VENTOLIN HFA) 108 (90 Base) MCG/ACT inhaler 4 puff (4 puffs Inhalation Given 10/02/19 0051)  predniSONE (DELTASONE) tablet 60 mg (60 mg Oral Given 10/02/19 0346)    ED Course  I have reviewed the triage vital signs and the nursing notes.  Pertinent labs & imaging results that were available during my care of the patient were reviewed by me and considered in my medical decision making (see chart for details).  MDM Rules/Calculators/A&P Dyspnea of uncertain cause.  Chest x-ray appears to show some bronchitic changes.  Pulse oximetry is normal.  Blood pressure is significantly elevated and he does not have a history of hypertension.  ECG appears to show some changes of left ventricular hypertrophy.  Labs show normal WBC, mild elevation of ALT of uncertain significance.  Will check troponin, BNP and also will screen for possible pulmonary embolism with D-dimer.  He will also be given a therapeutic trial of albuterol inhaler.  Old records are reviewed, and he has no relevant past visits.  He noted no improvement with albuterol.  Troponin and D-dimer were both normal as is BNP.  Cause for his dyspnea is not clear.  He was ambulated and there was some modest oxygen desaturation to 89%.  He will be referred to pulmonary clinic for further evaluation, will send home with a short course of prednisone.  Return precautions discussed.  Final Clinical Impression(s) / ED Diagnoses Final diagnoses:  Shortness of breath  Elevated blood-pressure reading without diagnosis of hypertension  Elevated ALT measurement    Rx / DC Orders ED Discharge Orders         Ordered     Ambulatory referral to Pulmonology    Comments: Dyspnea - unclear cause   10/02/19 0339    predniSONE (DELTASONE) 50 MG tablet  Daily     10/02/19 6045           Delora Fuel, MD 40/98/11 [phone]

## 2019-10-02 NOTE — ED Notes (Signed)
This tech ambulated pt, spo2 dropped to 89%

## 2019-10-05 ENCOUNTER — Other Ambulatory Visit: Payer: Self-pay

## 2019-10-05 ENCOUNTER — Ambulatory Visit (INDEPENDENT_AMBULATORY_CARE_PROVIDER_SITE_OTHER)
Admission: RE | Admit: 2019-10-05 | Discharge: 2019-10-05 | Disposition: A | Discharge status: Home / Self Care | Payer: BC Managed Care – PPO | Source: Ambulatory Visit | Attending: Critical Care Medicine | Admitting: Critical Care Medicine

## 2019-10-05 ENCOUNTER — Ambulatory Visit: Payer: BC Managed Care – PPO | Admitting: Critical Care Medicine

## 2019-10-05 ENCOUNTER — Encounter: Payer: Self-pay | Admitting: Critical Care Medicine

## 2019-10-05 VITALS — BP 130/70 | HR 71 | Temp 97.7°F | Ht 66.0 in | Wt 162.8 lb

## 2019-10-05 DIAGNOSIS — R0600 Dyspnea, unspecified: Secondary | ICD-10-CM

## 2019-10-05 DIAGNOSIS — R0609 Other forms of dyspnea: Secondary | ICD-10-CM

## 2019-10-05 DIAGNOSIS — R0602 Shortness of breath: Secondary | ICD-10-CM | POA: Diagnosis not present

## 2019-10-05 MED ORDER — IOHEXOL 350 MG/ML SOLN
80.0000 mL | Freq: Once | INTRAVENOUS | Status: AC | PRN
  Administered 2019-10-05: 80 mL via INTRAVENOUS

## 2019-10-05 NOTE — Patient Instructions (Addendum)
Thank you for visiting Dr. Chestine Spore at [facility]. We recommend the following: Orders Placed This Encounter  Procedures  . CT ANGIO CHEST PE W OR WO CONTRAST  . Pulmonary function test   Orders Placed This Encounter  Procedures  . CT ANGIO CHEST PE W OR WO CONTRAST    Sherry/DC Ins-BCBS No diabetes No HTN     Standing Status:   Future    Standing Expiration Date:   01/04/2021    Scheduling Instructions:     Needs to be done 5/10 or 5/11    Order Specific Question:   If indicated for the ordered procedure, I authorize the administration of contrast media per Radiology protocol    Answer:   Yes    Order Specific Question:   Preferred imaging location?    Answer:   [place] - [facility]    Order Specific Question:   Radiology Contrast Protocol - do NOT remove file path    Answer:   [email or web address]  . Pulmonary function test    Standing Status:   Future    Standing Expiration Date:   10/04/2020    Order Specific Question:   Where should this test be performed?    Answer:    Pulmonary    Order Specific Question:   Full PFT: includes the following: basic spirometry, spirometry pre & post bronchodilator, diffusion capacity (DLCO), lung volumes    Answer:   Full PFT    No orders of the defined types were placed in this encounter.   Return in about 1 month (around 11/05/2019).    Please do your part to reduce the spread of COVID-19.

## 2019-10-05 NOTE — Progress Notes (Signed)
Please let Mr. Villamar know that there is no blood clot or other abnormalities on his CT scan, so there is nothing different that we need to do. I am not sure why his saturations were lower, but this CT san does not explain it. We will follow up in 1 month as planned. He should let me know or return to the ED if symptoms return. Thanks!  LPC

## 2019-10-05 NOTE — Progress Notes (Signed)
Synopsis: Referred in May 3500 for SOB by Delora Fuel, MD.  Subjective:   PATIENT ID: Carlos Carpenter GENDER: male DOB: 12/30/1970, MRN: [id number]  Chief Complaint  Patient presents with  . Consult    SOB increased in last 4 days    Carlos Carpenter is a 49 year old gentleman who presents for evaluation of shortness of breath.  This began suddenly last week, prompting ED visit.  Two days prior he had been playing disc golf and he developed nausea and vomiting.  He does not think he had any aspiration during this.  2 days later he had sudden episode where he could not catch his breath.  Had a mild cough in his throat, clammy sweats but no fever.  No wheezing.  At that time he had high blood pressures and a low oxygen saturation of 95%.  His PCP sent him to the ED, where he was found to desaturate to 89%, but had a normal chest x-ray, D-dimer.  He was discharged with pulmonary follow-up prescribed prednisone 50 mg daily for 5 days.  He has 2 more days.  Today's the first day that his symptoms have felt improved.  He has constant dull chest pain in the center part of his chest that is new today for the past several hours, not worsening.  No leg edema, recent travel, cancer, surgeries.  He has not been tested for Covid during this, but is up-to-date on his vaccines and has not had any known contacts with a case.  He has a history of chronic allergies for which he takes Zyrtec and Flonase.  He stopped taking his Zyrtec about a week ago without worsening allergy symptoms.  He has a never smoker and has no previous history of lung issues.  The only family history of lung diseases pleurisy in his brother associated with pneumonia.    Past Medical History:  Diagnosis Date  . Hyperlipemia   . Hypertension      Family History  Problem Relation Age of Onset  . Hypertension Father   . Hyperlipidemia Father   . Pleurisy Brother   . Hypertension Brother   . Hyperlipidemia Brother      Past Surgical  History:  Procedure Laterality Date  . NASAL SEPTUM SURGERY      Social History   Socioeconomic History  . Marital status: Divorced    Spouse name: Not on file  . Number of children: Not on file  . Years of education: Not on file  . Highest education level: Not on file  Occupational History  . Not on file  Tobacco Use  . Smoking status: Never Smoker  . Smokeless tobacco: Never Used  Substance and Sexual Activity  . Alcohol use: Not on file  . Drug use: Not on file  . Sexual activity: Not on file  Other Topics Concern  . Not on file  Social History Narrative  . Not on file   Social Determinants of Health   Financial Resource Strain:   . Difficulty of Paying Living Expenses:   Food Insecurity:   . Worried About Charity fundraiser in the Last Year:   . Arboriculturist in the Last Year:   Transportation Needs:   . Film/video editor (Medical):   Marland Kitchen Lack of Transportation (Non-Medical):   Physical Activity:   . Days of Exercise per Week:   . Minutes of Exercise per Session:   Stress:   . Feeling of Stress :  Social Connections:   . Frequency of Communication with Friends and Family:   . Frequency of Social Gatherings with Friends and Family:   . Attends Religious Services:   . Active Member of Clubs or Organizations:   . Attends Banker Meetings:   Marland Kitchen Marital Status:   Intimate Partner Violence:   . Fear of Current or Ex-Partner:   . Emotionally Abused:   Marland Kitchen Physically Abused:   . Sexually Abused:      No Known Allergies   Immunization History  Administered Date(s) Administered  . Influenza Whole 03/07/2019  . Moderna SARS-COVID-2 Vaccination 08/06/2019, 08/27/2019    Outpatient Medications Prior to Visit  Medication Sig Dispense Refill  . cetirizine (ZYRTEC) 10 MG tablet Take 10 mg by mouth daily as needed for allergies.    . finasteride (PROPECIA) 1 MG tablet Take 1 mg by mouth daily.    . fluticasone (FLONASE) 50 MCG/ACT nasal spray  Place 2 sprays into both nostrils daily as needed for allergies or rhinitis.    . hydrochlorothiazide (HYDRODIURIL) 25 MG tablet Take 25 mg by mouth daily.    Marland Kitchen ibuprofen (ADVIL) 200 MG tablet Take 600 mg by mouth every 8 (eight) hours as needed for fever or moderate pain.    . predniSONE (DELTASONE) 50 MG tablet Take 1 tablet (50 mg total) by mouth daily. 5 tablet 0  . rosuvastatin (CRESTOR) 10 MG tablet Take 10 mg by mouth at bedtime.    Marland Kitchen zolpidem (AMBIEN) 10 MG tablet Take 10 mg by mouth at bedtime as needed for sleep.     No facility-administered medications prior to visit.    Review of Systems  Constitutional: Negative for chills, fever and weight loss.       Decreased appetite  HENT: Negative for congestion.   Respiratory: Positive for cough and shortness of breath. Negative for wheezing.   Cardiovascular: Positive for chest pain. Negative for leg swelling.  Gastrointestinal: Negative for nausea and vomiting.  Endo/Heme/Allergies: Positive for environmental allergies.     Objective:   Vitals:   10/05/19 1122  BP: 130/70  Pulse: 71  Temp: 97.7 F (36.5 C)  TempSrc: Temporal  SpO2: 98%  Weight: 162 lb 12.8 oz (73.8 kg)  Height: 5\' 6"  (1.676 m)   98% on   RA BMI Readings from Last 3 Encounters:  10/05/19 26.28 kg/m   Wt Readings from Last 3 Encounters:  10/05/19 162 lb 12.8 oz (73.8 kg)    Physical Exam Vitals reviewed.  Constitutional:      General: He is not in acute distress.    Appearance: He is not ill-appearing.  HENT:     Head: Normocephalic and atraumatic.  Eyes:     General: No scleral icterus. Cardiovascular:     Rate and Rhythm: Normal rate and regular rhythm.     Heart sounds: No murmur.  Pulmonary:     Comments: Breathing comfortably on RA, CTAB. No conversational dyspnea. Abdominal:     General: There is no distension.     Palpations: Abdomen is soft.  Musculoskeletal:        General: No swelling or deformity.     Cervical back: Neck  supple.  Lymphadenopathy:     Cervical: No cervical adenopathy.  Skin:    General: Skin is warm and dry.     Findings: No rash.  Neurological:     General: No focal deficit present.     Mental Status: He is alert.  Coordination: Coordination normal.  Psychiatric:        Mood and Affect: Mood normal.        Behavior: Behavior normal.      CBC    Component Value Date/Time   WBC 8.1 10/01/2019 1733   RBC 6.28 (H) 10/01/2019 1733   HGB 16.5 10/01/2019 1733   HGB 14.7 08/16/2012 1912   HCT 52.4 (H) 10/01/2019 1733   HCT 44.7 08/16/2012 1912   PLT 214 10/01/2019 1733   PLT 268 08/16/2012 1912   MCV 83.4 10/01/2019 1733   MCV 82 08/16/2012 1912   MCH 26.3 10/01/2019 1733   MCHC 31.5 10/01/2019 1733   RDW 13.7 10/01/2019 1733   RDW 13.6 08/16/2012 1912   LYMPHSABS 4.4 (H) 08/16/2012 1912   MONOABS 0.6 08/16/2012 1912   EOSABS 0.1 08/16/2012 1912   BASOSABS 0.1 08/16/2012 1912    CHEMISTRY Recent Labs  Lab 10/01/19 1733  NA 139  K 4.0  CL 97*  CO2 30  GLUCOSE 96  BUN 19  CREATININE 1.11  CALCIUM 9.8   Estimated Creatinine Clearance: 72.6 mL/min (by C-G formula based on SCr of 1.11 mg/dL).   Chest Imaging- films reviewed: CXR, 2 view 10/01/2019-possible lower lobe bullous disease, increased retrosternal airspace, minimal lung markings in lower posterior lungs.  CTA chest 10/05/2019 -no PE, no parenchymal abnormalities.  Pulmonary Functions Testing Results: No flowsheet data found.      Assessment & Plan:     ICD-10-CM   1. DOE (dyspnea on exertion)  R06.00 Pulmonary function test    CT ANGIO CHEST PE W OR WO CONTRAST    CANCELED: CT ANGIO CHEST PE W OR WO CONTRAST   Acute onset DOE associated with a notable change in saturation. No significant abnormalities on physical exam to explain this. Prev undetectable d-dimer should essentially rule out VTE.  Normal BNP level.  Raises a question for possible aspiration pneumonitis that was not visible on chest  x-ray. -CTA chest evaluate pulmonary vasculature and lung parenchyma.  Although I have a low suspicion for Covid pneumonia, other potential viral illnesses could explain the sudden onset of his symptoms, which include systemic symptoms.  CTA does not explain his symptoms or desaturation. -Will defer PFTs if he has ongoing symptoms at follow-up. -Complete prescribed course of prednisone.  Has an inhaler, although he is not wheezing on exam.  RTC in 1 month.   Current Outpatient Medications:  .  cetirizine (ZYRTEC) 10 MG tablet, Take 10 mg by mouth daily as needed for allergies., Disp: , Rfl:  .  finasteride (PROPECIA) 1 MG tablet, Take 1 mg by mouth daily., Disp: , Rfl:  .  fluticasone (FLONASE) 50 MCG/ACT nasal spray, Place 2 sprays into both nostrils daily as needed for allergies or rhinitis., Disp: , Rfl:  .  hydrochlorothiazide (HYDRODIURIL) 25 MG tablet, Take 25 mg by mouth daily., Disp: , Rfl:  .  ibuprofen (ADVIL) 200 MG tablet, Take 600 mg by mouth every 8 (eight) hours as needed for fever or moderate pain., Disp: , Rfl:  .  predniSONE (DELTASONE) 50 MG tablet, Take 1 tablet (50 mg total) by mouth daily., Disp: 5 tablet, Rfl: 0 .  rosuvastatin (CRESTOR) 10 MG tablet, Take 10 mg by mouth at bedtime., Disp: , Rfl:  .  zolpidem (AMBIEN) 10 MG tablet, Take 10 mg by mouth at bedtime as needed for sleep., Disp: , Rfl:     Steffanie Dunn, DO [facility] 10/05/2019 7:16  PM  

## 2019-10-06 DIAGNOSIS — R0602 Shortness of breath: Secondary | ICD-10-CM | POA: Diagnosis not present

## 2019-10-06 DIAGNOSIS — F419 Anxiety disorder, unspecified: Secondary | ICD-10-CM | POA: Diagnosis not present

## 2019-10-06 DIAGNOSIS — I10 Essential (primary) hypertension: Secondary | ICD-10-CM | POA: Diagnosis not present

## 2019-10-06 DIAGNOSIS — Z09 Encounter for follow-up examination after completed treatment for conditions other than malignant neoplasm: Secondary | ICD-10-CM | POA: Diagnosis not present

## 2019-10-08 NOTE — Progress Notes (Signed)
Cardiology Office Note:   Date:  10/09/2019  NAME:  Carlos Carpenter    MRN: [id number] DOB:  15-Nov-1970   PCP:  Kathyrn Lass, MD  Cardiologist:  Evalina Field, MD   Referring MD: Christa See, FNP   Chief Complaint  Patient presents with   Shortness of Breath   History of Present Illness:   Carlos Carpenter is a 49 y.o. male with a hx of HTN, HLD who is being seen today for the evaluation of shortness of breath at the request of Christa See, [place]. Recent evaluation by pulmonary. CT PE study unremarkable. BNP 15. Troponin negative. CT PE study without coronary calcium noted.   He reports 2 weeks ago will find this cough he had an episode of nausea while playing.  He reports he then had a sensation of shortness of breath.  He reports his symptoms did not resolve and he was evaluated by his primary care physician.  He was evaluated in the office and noted to have oxygen saturations around 95-96%.  He was then sent to the emergency room.  He reports that in the emergency room his work-up was negative as above.  Troponins were negative BNP normal CT negative.  He did have a possible old anteroseptal infarct noted on his EKG which did bother him.  He was subsequently evaluated by pulmonology and underwent a CTA study which showed no pulmonary embolism.  There is no coronary calcium seen on either.  He reports he did hold his recent allergy medicine and has noticed some improvement with restarting this.  He reports over the past few weeks he is slowly gotten better and today feels much better.  He reports his shortness of breath occurred mainly with talking.  It was actually improved with exertion.  It did improve with walking and he reports that he paced and improved.  He does have a history of anxiety but no more anxiety than usual.  He denies any chest pain or pressure with exertion.  His symptoms were occurring daily but as stated above they are resolving.  He is a never smoker, and consumes  alcohol moderation.  He does not use illicit drugs.  He does report a history of heart attack in his father.  He reports he does not exercise routinely but does play does golf 2 days/week.  There is no issues playing golf.  Total cholesterol 139, triglycerides 93, HDL 41, LDL 79  Past Medical History: Past Medical History:  Diagnosis Date   Hyperlipemia    Hypertension     Past Surgical History: Past Surgical History:  Procedure Laterality Date   NASAL SEPTUM SURGERY      Current Medications: Current Meds  Medication Sig   cetirizine (ZYRTEC) 10 MG tablet Take 10 mg by mouth daily as needed for allergies.   finasteride (PROPECIA) 1 MG tablet Take 1 mg by mouth daily.   fluticasone (FLONASE) 50 MCG/ACT nasal spray Place 2 sprays into both nostrils daily as needed for allergies or rhinitis.   hydrochlorothiazide (HYDRODIURIL) 25 MG tablet Take 25 mg by mouth daily.   ibuprofen (ADVIL) 200 MG tablet Take 600 mg by mouth every 8 (eight) hours as needed for fever or moderate pain.   rosuvastatin (CRESTOR) 10 MG tablet Take 10 mg by mouth at bedtime.   zolpidem (AMBIEN) 10 MG tablet Take 10 mg by mouth at bedtime as needed for sleep.     Allergies:    Patient has no known allergies.  Social History: Social History   Socioeconomic History   Marital status: Divorced    Spouse name: Not on file   Number of children: 1   Years of education: Not on file   Highest education level: Not on file  Occupational History   Not on file  Tobacco Use   Smoking status: Never Smoker   Smokeless tobacco: Never Used  Substance and Sexual Activity   Alcohol use: Not on file   Drug use: Not on file   Sexual activity: Not on file  Other Topics Concern   Not on file  Social History Narrative   Not on file   Social Determinants of Health   Financial Resource Strain:    Difficulty of Paying Living Expenses:   Food Insecurity:    Worried About Programme researcher, broadcasting/film/video  in the Last Year:    Barista in the Last Year:   Transportation Needs:    Freight forwarder (Medical):    Lack of Transportation (Non-Medical):   Physical Activity:    Days of Exercise per Week:    Minutes of Exercise per Session:   Stress:    Feeling of Stress :   Social Connections:    Frequency of Communication with Friends and Family:    Frequency of Social Gatherings with Friends and Family:    Attends Religious Services:    Active Member of Clubs or Organizations:    Attends Engineer, structural:    Marital Status:      Family History: The patient's family history includes Heart disease in his father; Hyperlipidemia in his brother and father; Hypertension in his brother and father; Pleurisy in his brother.  ROS:   All other ROS reviewed and negative. Pertinent positives noted in the HPI.     EKGs/Labs/Other Studies Reviewed:   The following studies were personally reviewed by me today:  EKG:  EKG is ordered today.  The ekg ordered today demonstrates normal sinus rhythm, heart 73, no acute ischemic changes, nonspecific ST-T changes, no evidence of prior infarction, and was personally reviewed by me.   Recent Labs: 10/01/2019: ALT 82; BUN 19; Creatinine, Ser 1.11; Hemoglobin 16.5; Platelets 214; Potassium 4.0; Sodium 139 10/02/2019: B Natriuretic Peptide 15.0   Recent Lipid Panel No results found for: CHOL, TRIG, HDL, CHOLHDL, VLDL, LDLCALC, LDLDIRECT  Physical Exam:   VS:  BP 130/88    Pulse 73    Ht 5\' 6"  (1.676 m)    Wt 168 lb 6.4 oz (76.4 kg)    SpO2 98%    BMI 27.18 kg/m    Wt Readings from Last 3 Encounters:  10/09/19 168 lb 6.4 oz (76.4 kg)  10/05/19 162 lb 12.8 oz (73.8 kg)    General: Well nourished, well developed, in no acute distress Heart: Atraumatic, normal size  Eyes: PEERLA, EOMI  Neck: Supple, no JVD Endocrine: No thryomegaly Cardiac: Normal S1, S2; RRR; no murmurs, rubs, or gallops Lungs: Clear to auscultation  bilaterally, no wheezing, rhonchi or rales  Abd: Soft, nontender, no hepatomegaly  Ext: No edema, pulses 2+ Musculoskeletal: No deformities, BUE and BLE strength normal and equal Skin: Warm and dry, no rashes   Neuro: Alert and oriented to person, place, time, and situation, CNII-XII grossly intact, no focal deficits  Psych: Normal mood and affect   ASSESSMENT:   Carlos Carpenter is a 49 y.o. male who presents for the following: 1. SOB (shortness of breath) on exertion   2.  Essential hypertension     PLAN:   1. SOB (shortness of breath) on exertion -Recent CT PE study negative for point embolism.  Shortness of breath actually occurs while talking and not with exertion.  He denies any chest pain or pressure.  His EKG on my review shows nonspecific ST-T changes and possible Q waves in the anterior leads related to hypertension.  I did review his CT PE study using bone windows and this shows no evidence of coronary calcium.  I have a low suspicion for obstructive CAD to explain his symptoms.  I think the easy thing to do to clarify his EKG would be to obtain an echocardiogram.  He is okay to do that.  Given that his BNP was normal I have a low suspicion for any congestive heart failure.  Symptoms appear to be improving on allergy medicine he will continue this.  I will give him the results of his echo by phone.  He will follow with [place] as needed.  2. Essential hypertension -Well-controlled continue current medications.   Disposition: Return if symptoms worsen or fail to improve.  Medication Adjustments/Labs and Tests Ordered: Current medicines are reviewed at length with the patient today.  Concerns regarding medicines are outlined above.  Orders Placed This Encounter  Procedures   EKG 12-Lead   ECHOCARDIOGRAM COMPLETE   No orders of the defined types were placed in this encounter.   Patient Instructions  Medication Instructions:  NO CHANGE *If you need a refill on your cardiac  medications before your next appointment, please call your pharmacy*   Lab Work: If you have labs (blood work) drawn today and your tests are completely normal, you will receive your results only by:  MyChart Message (if you have MyChart) OR  A paper copy in the mail If you have any lab test that is abnormal or we need to change your treatment, we will call you to review the results.   Testing/Procedures: Your physician has requested that you have an echocardiogram. Echocardiography is a painless test that uses sound waves to create images of your heart. It provides your doctor with information about the size and shape of your heart and how well your hearts chambers and valves are working. This procedure takes approximately one hour. There are no restrictions for this procedure.[address]     Follow-Up: At [facility], you and your health needs are our priority.  As part of our continuing mission to provide you with exceptional heart care, we have created designated Provider Care Teams.  These Care Teams include your primary Cardiologist (physician) and Advanced Practice Providers (APPs -  Physician Assistants and Nurse Practitioners) who all work together to provide you with the care you need, when you need it.  We recommend signing up for the patient portal called "MyChart".  Sign up information is provided on this After Visit Summary.  MyChart is used to connect with patients for Virtual Visits (Telemedicine).  Patients are able to view lab/test results, encounter notes, upcoming appointments, etc.  Non-urgent messages can be sent to your provider as well.   To learn more about what you can do with MyChart, go to [email or web address].    Your next appointment:    AS NEEDED   Signed, Gerri Spore T. Flora Lipps, MD [facility]  [address], [address] [place], [place] [address] [phone]  10/09/2019 2:12 PM

## 2019-10-09 ENCOUNTER — Encounter: Payer: Self-pay | Admitting: Cardiovascular Disease

## 2019-10-09 ENCOUNTER — Other Ambulatory Visit: Payer: Self-pay

## 2019-10-09 ENCOUNTER — Ambulatory Visit: Payer: BC Managed Care – PPO | Admitting: Cardiovascular Disease

## 2019-10-09 VITALS — BP 130/88 | HR 73 | Ht 66.0 in | Wt 168.4 lb

## 2019-10-09 DIAGNOSIS — R0602 Shortness of breath: Secondary | ICD-10-CM

## 2019-10-09 DIAGNOSIS — I10 Essential (primary) hypertension: Secondary | ICD-10-CM

## 2019-10-09 NOTE — Patient Instructions (Signed)
Medication Instructions:  NO CHANGE *If you need a refill on your cardiac medications before your next appointment, please call your pharmacy*   Lab Work: If you have labs (blood work) drawn today and your tests are completely normal, you will receive your results only by: . MyChart Message (if you have MyChart) OR . A paper copy in the mail If you have any lab test that is abnormal or we need to change your treatment, we will call you to review the results.   Testing/Procedures: Your physician has requested that you have an echocardiogram. Echocardiography is a painless test that uses sound waves to create images of your heart. It provides your doctor with information about the size and shape of your heart and how well your heart's chambers and valves are working. This procedure takes approximately one hour. There are no restrictions for this procedure.[address]   Follow-Up: At [facility], you and your health needs are our priority.  As part of our continuing mission to provide you with exceptional heart care, we have created designated Provider Care Teams.  These Care Teams include your primary Cardiologist (physician) and Advanced Practice Providers (APPs -  Physician Assistants and Nurse Practitioners) who all work together to provide you with the care you need, when you need it.  We recommend signing up for the patient portal called "MyChart".  Sign up information is provided on this After Visit Summary.  MyChart is used to connect with patients for Virtual Visits (Telemedicine).  Patients are able to view lab/test results, encounter notes, upcoming appointments, etc.  Non-urgent messages can be sent to your provider as well.   To learn more about what you can do with MyChart, go to [email or web address].    Your next appointment:    AS NEEDED 

## 2019-10-12 ENCOUNTER — Telehealth: Payer: Self-pay

## 2019-10-12 NOTE — Telephone Encounter (Signed)
EKG ON FILE °

## 2019-10-14 ENCOUNTER — Encounter (HOSPITAL_COMMUNITY): Payer: Self-pay

## 2019-10-17 DIAGNOSIS — M5386 Other specified dorsopathies, lumbar region: Secondary | ICD-10-CM | POA: Diagnosis not present

## 2019-10-17 DIAGNOSIS — M9901 Segmental and somatic dysfunction of cervical region: Secondary | ICD-10-CM | POA: Diagnosis not present

## 2019-10-17 DIAGNOSIS — R519 Headache, unspecified: Secondary | ICD-10-CM | POA: Diagnosis not present

## 2019-10-17 DIAGNOSIS — M9902 Segmental and somatic dysfunction of thoracic region: Secondary | ICD-10-CM | POA: Diagnosis not present

## 2019-10-19 DIAGNOSIS — E7800 Pure hypercholesterolemia, unspecified: Secondary | ICD-10-CM | POA: Diagnosis not present

## 2019-10-19 DIAGNOSIS — L649 Androgenic alopecia, unspecified: Secondary | ICD-10-CM | POA: Diagnosis not present

## 2019-10-19 DIAGNOSIS — G4700 Insomnia, unspecified: Secondary | ICD-10-CM | POA: Diagnosis not present

## 2019-10-30 ENCOUNTER — Other Ambulatory Visit: Payer: Self-pay

## 2019-10-30 ENCOUNTER — Ambulatory Visit (HOSPITAL_COMMUNITY): Payer: BC Managed Care – PPO | Attending: Cardiology

## 2019-10-30 DIAGNOSIS — R0602 Shortness of breath: Secondary | ICD-10-CM | POA: Diagnosis not present

## 2019-11-07 DIAGNOSIS — M9901 Segmental and somatic dysfunction of cervical region: Secondary | ICD-10-CM | POA: Diagnosis not present

## 2019-11-07 DIAGNOSIS — M5386 Other specified dorsopathies, lumbar region: Secondary | ICD-10-CM | POA: Diagnosis not present

## 2019-11-07 DIAGNOSIS — R519 Headache, unspecified: Secondary | ICD-10-CM | POA: Diagnosis not present

## 2019-11-07 DIAGNOSIS — M9902 Segmental and somatic dysfunction of thoracic region: Secondary | ICD-10-CM | POA: Diagnosis not present

## 2019-11-21 ENCOUNTER — Other Ambulatory Visit (HOSPITAL_COMMUNITY)
Admission: RE | Admit: 2019-11-21 | Discharge: 2019-11-21 | Disposition: A | Discharge status: Home / Self Care | Payer: BC Managed Care – PPO | Source: Ambulatory Visit | Attending: Critical Care Medicine | Admitting: Critical Care Medicine

## 2019-11-21 DIAGNOSIS — Z01812 Encounter for preprocedural laboratory examination: Secondary | ICD-10-CM | POA: Diagnosis not present

## 2019-11-21 DIAGNOSIS — Z20822 Contact with and (suspected) exposure to covid-19: Secondary | ICD-10-CM | POA: Insufficient documentation

## 2019-11-21 LAB — SARS CORONAVIRUS 2 (TAT 6-24 HRS): SARS Coronavirus 2: NEGATIVE

## 2019-11-24 ENCOUNTER — Other Ambulatory Visit: Payer: Self-pay

## 2019-11-24 ENCOUNTER — Ambulatory Visit (INDEPENDENT_AMBULATORY_CARE_PROVIDER_SITE_OTHER): Payer: BC Managed Care – PPO | Admitting: Critical Care Medicine

## 2019-11-24 ENCOUNTER — Encounter: Payer: Self-pay | Admitting: Critical Care Medicine

## 2019-11-24 ENCOUNTER — Ambulatory Visit: Payer: BC Managed Care – PPO | Admitting: Critical Care Medicine

## 2019-11-24 VITALS — BP 140/88 | HR 75 | Ht 66.0 in | Wt 169.6 lb

## 2019-11-24 DIAGNOSIS — R0609 Other forms of dyspnea: Secondary | ICD-10-CM

## 2019-11-24 DIAGNOSIS — R519 Headache, unspecified: Secondary | ICD-10-CM | POA: Diagnosis not present

## 2019-11-24 DIAGNOSIS — M5386 Other specified dorsopathies, lumbar region: Secondary | ICD-10-CM | POA: Diagnosis not present

## 2019-11-24 DIAGNOSIS — M9901 Segmental and somatic dysfunction of cervical region: Secondary | ICD-10-CM | POA: Diagnosis not present

## 2019-11-24 DIAGNOSIS — M9902 Segmental and somatic dysfunction of thoracic region: Secondary | ICD-10-CM | POA: Diagnosis not present

## 2019-11-24 DIAGNOSIS — R0600 Dyspnea, unspecified: Secondary | ICD-10-CM

## 2019-11-24 DIAGNOSIS — J309 Allergic rhinitis, unspecified: Secondary | ICD-10-CM | POA: Diagnosis not present

## 2019-11-24 LAB — PULMONARY FUNCTION TEST
DL/VA % pred: 106 %
DL/VA: 4.82 ml/min/mmHg/L
DLCO cor % pred: 103 %
DLCO cor: 26.88 ml/min/mmHg
DLCO unc % pred: 108 %
DLCO unc: 28.22 ml/min/mmHg
FEF 25-75 Post: 4.01 L/sec
FEF 25-75 Pre: 3.56 L/sec
FEF2575-%Change-Post: 12 %
FEF2575-%Pred-Post: 126 %
FEF2575-%Pred-Pre: 112 %
FEV1-%Change-Post: 2 %
FEV1-%Pred-Post: 98 %
FEV1-%Pred-Pre: 96 %
FEV1-Post: 3.43 L
FEV1-Pre: 3.33 L
FEV1FVC-%Change-Post: 1 %
FEV1FVC-%Pred-Pre: 105 %
FEV6-%Change-Post: 1 %
FEV6-%Pred-Post: 95 %
FEV6-%Pred-Pre: 93 %
FEV6-Post: 4.07 L
FEV6-Pre: 4.01 L
FEV6FVC-%Change-Post: 0 %
FEV6FVC-%Pred-Post: 103 %
FEV6FVC-%Pred-Pre: 103 %
FVC-%Change-Post: 1 %
FVC-%Pred-Post: 91 %
FVC-%Pred-Pre: 91 %
FVC-Post: 4.07 L
FVC-Pre: 4.03 L
Post FEV1/FVC ratio: 84 %
Post FEV6/FVC ratio: 100 %
Pre FEV1/FVC ratio: 83 %
Pre FEV6/FVC Ratio: 100 %
RV % pred: 80 %
RV: 1.44 L
TLC % pred: 88 %
TLC: 5.47 L

## 2019-11-24 NOTE — Progress Notes (Signed)
Synopsis: Referred in May 2021 for SOB by Sigmund HazelMiller, Lisa, MD.  Subjective:   PATIENT ID: Carlos Carpenter GENDER: male DOB: July 18, 1970, MRN: [id number]  Chief Complaint  Patient presents with  . Follow-up    Patient did PFT today. Feeling beter since last visit and has started taking Zyrtec again. Feels like he has something in his throat and needs to cough or clear his throat    Carlos Carpenter is a 49 y/o gentleman who presents for follow-up of an episode of shortness of breath with a negative CTA.  He has had no ongoing symptoms.  He was evaluated by cardiology on 10/09/2019-Dr. O'Neal's note reviewed.  No cardiac cause of shortness of breath.  No recurrent episodes of shortness of breath.  He restarted taking his Zyrtec due to feeling like he has something in his throat; he feels like he needs to cough when he takes a deep breath.  No sore throat.  No improvement in symptoms and using albuterol earlier during his PFTs.  He is continue taking Flonase nasal spray.    OV 10/05/19: Carlos Carpenter is a 49 year old gentleman who presents for evaluation of shortness of breath.  This began suddenly last week, prompting ED visit.  Two days prior he had been playing disc golf and he developed nausea and vomiting.  He does not think he had any aspiration during this.  2 days later he had sudden episode where he could not catch his breath.  Had a mild cough in his throat, clammy sweats but no fever.  No wheezing.  At that time he had high blood pressures and a low oxygen saturation of 95%.  His PCP sent him to the ED, where he was found to desaturate to 89%, but had a normal chest x-ray, D-dimer.  He was discharged with pulmonary follow-up prescribed prednisone 50 mg daily for 5 days.  He has 2 more days.  Today's the first day that his symptoms have felt improved.  He has constant dull chest pain in the center part of his chest that is new today for the past several hours, not worsening.  No leg edema, recent  travel, cancer, surgeries.  He has not been tested for Covid during this, but is up-to-date on his vaccines and has not had any known contacts with a case.  He has a history of chronic allergies for which he takes Zyrtec and Flonase.  He stopped taking his Zyrtec about a week ago without worsening allergy symptoms.  He has a never smoker and has no previous history of lung issues.  The only family history of lung diseases pleurisy in his brother associated with pneumonia.   Past Medical History:  Diagnosis Date  . Hyperlipemia   . Hypertension      Family History  Problem Relation Age of Onset  . Hypertension Father   . Hyperlipidemia Father   . Heart disease Father   . Pleurisy Brother   . Hypertension Brother   . Hyperlipidemia Brother      Past Surgical History:  Procedure Laterality Date  . NASAL SEPTUM SURGERY      Social History   Socioeconomic History  . Marital status: Divorced    Spouse name: Not on file  . Number of children: 1  . Years of education: Not on file  . Highest education level: Not on file  Occupational History  . Not on file  Tobacco Use  . Smoking status: Never Smoker  . Smokeless tobacco:  Never Used  Vaping Use  . Vaping Use: Never used  Substance and Sexual Activity  . Alcohol use: Never  . Drug use: Not Currently    Comment: College  . Sexual activity: Not Currently  Other Topics Concern  . Not on file  Social History Narrative  . Not on file   Social Determinants of Health   Financial Resource Strain:   . Difficulty of Paying Living Expenses:   Food Insecurity:   . Worried About Programme researcher, broadcasting/film/video in the Last Year:   . Barista in the Last Year:   Transportation Needs:   . Freight forwarder (Medical):   Marland Kitchen Lack of Transportation (Non-Medical):   Physical Activity:   . Days of Exercise per Week:   . Minutes of Exercise per Session:   Stress:   . Feeling of Stress :   Social Connections:   . Frequency of  Communication with Friends and Family:   . Frequency of Social Gatherings with Friends and Family:   . Attends Religious Services:   . Active Member of Clubs or Organizations:   . Attends Banker Meetings:   Marland Kitchen Marital Status:   Intimate Partner Violence:   . Fear of Current or Ex-Partner:   . Emotionally Abused:   Marland Kitchen Physically Abused:   . Sexually Abused:      No Known Allergies   Immunization History  Administered Date(s) Administered  . Influenza Whole 03/07/2019  . Moderna SARS-COVID-2 Vaccination 08/06/2019, 08/27/2019    Outpatient Medications Prior to Visit  Medication Sig Dispense Refill  . cetirizine (ZYRTEC) 10 MG tablet Take 10 mg by mouth daily as needed for allergies.    . finasteride (PROPECIA) 1 MG tablet Take 1 mg by mouth daily.    . fluticasone (FLONASE) 50 MCG/ACT nasal spray Place 2 sprays into both nostrils daily as needed for allergies or rhinitis.    . hydrochlorothiazide (HYDRODIURIL) 25 MG tablet Take 25 mg by mouth daily.    Marland Kitchen ibuprofen (ADVIL) 200 MG tablet Take 600 mg by mouth every 8 (eight) hours as needed for fever or moderate pain.    . rosuvastatin (CRESTOR) 10 MG tablet Take 10 mg by mouth at bedtime.    Marland Kitchen zolpidem (AMBIEN) 10 MG tablet Take 10 mg by mouth at bedtime as needed for sleep.     No facility-administered medications prior to visit.    Review of Systems  Constitutional: Negative for chills, fever and weight loss.       Decreased appetite  HENT: Negative for congestion.   Respiratory: Positive for cough and shortness of breath. Negative for wheezing.   Cardiovascular: Positive for chest pain. Negative for leg swelling.  Gastrointestinal: Negative for nausea and vomiting.  Endo/Heme/Allergies: Positive for environmental allergies.     Objective:   Vitals:   11/24/19 1341  BP: 140/88  Pulse: 75  SpO2: 98%  Weight: 169 lb 9.6 oz (76.9 kg)  Height: 5\' 6"  (1.676 m)   98% on   RA BMI Readings from Last 3  Encounters:  11/24/19 27.37 kg/m  10/09/19 27.18 kg/m  10/05/19 26.28 kg/m   Wt Readings from Last 3 Encounters:  11/24/19 169 lb 9.6 oz (76.9 kg)  10/09/19 168 lb 6.4 oz (76.4 kg)  10/05/19 162 lb 12.8 oz (73.8 kg)    Physical Exam Vitals reviewed.  Constitutional:      General: He is not in acute distress.    Appearance: He is  not ill-appearing.  HENT:     Head: Normocephalic and atraumatic.  Eyes:     General: No scleral icterus. Cardiovascular:     Rate and Rhythm: Normal rate and regular rhythm.     Heart sounds: No murmur heard.   Pulmonary:     Comments: Breathing comfortably on room air, no conversational dyspnea.  No observed coughing.  Clear to auscultation bilaterally. Abdominal:     General: There is no distension.     Palpations: Abdomen is soft.  Musculoskeletal:        General: No swelling or deformity.     Cervical back: Neck supple.  Lymphadenopathy:     Cervical: No cervical adenopathy.  Skin:    General: Skin is warm and dry.     Findings: No rash.  Neurological:     General: No focal deficit present.     Mental Status: He is alert.     Coordination: Coordination normal.  Psychiatric:        Mood and Affect: Mood normal.        Behavior: Behavior normal.      CBC    Component Value Date/Time   WBC 8.1 10/01/2019 1733   RBC 6.28 (H) 10/01/2019 1733   HGB 16.5 10/01/2019 1733   HGB 14.7 08/16/2012 1912   HCT 52.4 (H) 10/01/2019 1733   HCT 44.7 08/16/2012 1912   PLT 214 10/01/2019 1733   PLT 268 08/16/2012 1912   MCV 83.4 10/01/2019 1733   MCV 82 08/16/2012 1912   MCH 26.3 10/01/2019 1733   MCHC 31.5 10/01/2019 1733   RDW 13.7 10/01/2019 1733   RDW 13.6 08/16/2012 1912   LYMPHSABS 4.4 (H) 08/16/2012 1912   MONOABS 0.6 08/16/2012 1912   EOSABS 0.1 08/16/2012 1912   BASOSABS 0.1 08/16/2012 1912    CHEMISTRY No results for input(s): NA, K, CL, CO2, GLUCOSE, BUN, CREATININE, CALCIUM, MG, PHOS in the last 168 hours. CrCl cannot be  calculated (Patient's most recent lab result is older than the maximum 21 days allowed.).   Chest Imaging- films reviewed: CXR, 2 view 10/01/2019-possible lower lobe bullous disease, increased retrosternal airspace, minimal lung markings in lower posterior lungs.  CTA chest 10/05/2019 -no PE, no parenchymal abnormalities.  Pulmonary Functions Testing Results: PFT Results Latest Ref Rng & Units 11/24/2019  FVC-Pre L 4.03  FVC-Predicted Pre % 91  FVC-Post L 4.07  FVC-Predicted Post % 91  Pre FEV1/FVC % % 83  Post FEV1/FCV % % 84  FEV1-Pre L 3.33  FEV1-Predicted Pre % 96  FEV1-Post L 3.43  DLCO UNC% % 108  DLCO COR %Predicted % 106  TLC L 5.47  TLC % Predicted % 88  RV % Predicted % 80   2021-no significant obstruction or bronchodilator reversibility.  No significant restriction.  Normal diffusion.  Normal PFT.  Echocardiogram 10/30/2019-LVEF 55 to 60%, grade 1 diastolic dysfunction.  Mildly dilated LA, normal RV and RA.  Mild TR, otherwise normal valves.     Assessment & Plan:     ICD-10-CM   1. DOE (dyspnea on exertion)  R06.00   2. Allergic rhinitis, unspecified seasonality, unspecified trigger  J30.9      Acute onset DOE associated with a notable change in saturation. No significant abnormalities on physical exam to explain this. Prev undetectable d-dimer should essentially rule out VTE.  Normal BNP level.  Raises a question for possible aspiration pneumonitis that was not visible on chest x-ray. -Reviewed PFTs.  No abnormalities.  I  suspect this was due to a transient noncritical etiology, which is resolved.  Allergic rhinosinusitis -Okay to taper off Zyrtec and Flonase.  Would discontinue these several days apart to ensure that symptoms do not return.  RTC PRN.   Current Outpatient Medications:  .  cetirizine (ZYRTEC) 10 MG tablet, Take 10 mg by mouth daily as needed for allergies., Disp: , Rfl:  .  finasteride (PROPECIA) 1 MG tablet, Take 1 mg by mouth daily., Disp: ,  Rfl:  .  fluticasone (FLONASE) 50 MCG/ACT nasal spray, Place 2 sprays into both nostrils daily as needed for allergies or rhinitis., Disp: , Rfl:  .  hydrochlorothiazide (HYDRODIURIL) 25 MG tablet, Take 25 mg by mouth daily., Disp: , Rfl:  .  ibuprofen (ADVIL) 200 MG tablet, Take 600 mg by mouth every 8 (eight) hours as needed for fever or moderate pain., Disp: , Rfl:  .  rosuvastatin (CRESTOR) 10 MG tablet, Take 10 mg by mouth at bedtime., Disp: , Rfl:  .  zolpidem (AMBIEN) 10 MG tablet, Take 10 mg by mouth at bedtime as needed for sleep., Disp: , Rfl:     Steffanie Dunn, DO [facility] 11/24/2019 1:56 PM

## 2019-11-24 NOTE — Progress Notes (Signed)
PFT done today. 

## 2019-11-24 NOTE — Patient Instructions (Addendum)
Thank you for visiting Dr. Donnell Wion at [facility]. We recommend the following:   Return if symptoms worsen or fail to improve.    Please do your part to reduce the spread of COVID-19. ' 

## 2019-12-19 DIAGNOSIS — R509 Fever, unspecified: Secondary | ICD-10-CM | POA: Diagnosis not present

## 2019-12-24 DIAGNOSIS — Z20822 Contact with and (suspected) exposure to covid-19: Secondary | ICD-10-CM | POA: Diagnosis not present

## 2019-12-26 DIAGNOSIS — R509 Fever, unspecified: Secondary | ICD-10-CM | POA: Diagnosis not present

## 2020-02-15 DIAGNOSIS — Z1159 Encounter for screening for other viral diseases: Secondary | ICD-10-CM | POA: Diagnosis not present

## 2020-02-18 DIAGNOSIS — Z8371 Family history of colonic polyps: Secondary | ICD-10-CM | POA: Diagnosis not present

## 2020-02-18 DIAGNOSIS — K635 Polyp of colon: Secondary | ICD-10-CM | POA: Diagnosis not present

## 2020-02-18 DIAGNOSIS — Z1211 Encounter for screening for malignant neoplasm of colon: Secondary | ICD-10-CM | POA: Diagnosis not present

## 2020-02-18 DIAGNOSIS — D120 Benign neoplasm of cecum: Secondary | ICD-10-CM | POA: Diagnosis not present

## 2020-03-07 DIAGNOSIS — Z23 Encounter for immunization: Secondary | ICD-10-CM | POA: Diagnosis not present

## 2020-04-14 DIAGNOSIS — M5386 Other specified dorsopathies, lumbar region: Secondary | ICD-10-CM | POA: Diagnosis not present

## 2020-04-14 DIAGNOSIS — M9901 Segmental and somatic dysfunction of cervical region: Secondary | ICD-10-CM | POA: Diagnosis not present

## 2020-04-14 DIAGNOSIS — M9902 Segmental and somatic dysfunction of thoracic region: Secondary | ICD-10-CM | POA: Diagnosis not present

## 2020-04-14 DIAGNOSIS — R519 Headache, unspecified: Secondary | ICD-10-CM | POA: Diagnosis not present

## 2020-04-24 DIAGNOSIS — R21 Rash and other nonspecific skin eruption: Secondary | ICD-10-CM | POA: Diagnosis not present

## 2020-05-13 DIAGNOSIS — M9902 Segmental and somatic dysfunction of thoracic region: Secondary | ICD-10-CM | POA: Diagnosis not present

## 2020-05-13 DIAGNOSIS — M9901 Segmental and somatic dysfunction of cervical region: Secondary | ICD-10-CM | POA: Diagnosis not present

## 2020-05-13 DIAGNOSIS — M5386 Other specified dorsopathies, lumbar region: Secondary | ICD-10-CM | POA: Diagnosis not present

## 2020-05-13 DIAGNOSIS — R519 Headache, unspecified: Secondary | ICD-10-CM | POA: Diagnosis not present

## 2020-07-14 DIAGNOSIS — M9902 Segmental and somatic dysfunction of thoracic region: Secondary | ICD-10-CM | POA: Diagnosis not present

## 2020-07-14 DIAGNOSIS — M5386 Other specified dorsopathies, lumbar region: Secondary | ICD-10-CM | POA: Diagnosis not present

## 2020-07-14 DIAGNOSIS — M9901 Segmental and somatic dysfunction of cervical region: Secondary | ICD-10-CM | POA: Diagnosis not present

## 2020-07-14 DIAGNOSIS — R519 Headache, unspecified: Secondary | ICD-10-CM | POA: Diagnosis not present

## 2020-08-17 DIAGNOSIS — M9901 Segmental and somatic dysfunction of cervical region: Secondary | ICD-10-CM | POA: Diagnosis not present

## 2020-08-17 DIAGNOSIS — M5441 Lumbago with sciatica, right side: Secondary | ICD-10-CM | POA: Diagnosis not present

## 2020-08-17 DIAGNOSIS — M9902 Segmental and somatic dysfunction of thoracic region: Secondary | ICD-10-CM | POA: Diagnosis not present

## 2020-08-17 DIAGNOSIS — M9903 Segmental and somatic dysfunction of lumbar region: Secondary | ICD-10-CM | POA: Diagnosis not present

## 2020-08-23 DIAGNOSIS — G4700 Insomnia, unspecified: Secondary | ICD-10-CM | POA: Diagnosis not present

## 2020-08-23 DIAGNOSIS — L649 Androgenic alopecia, unspecified: Secondary | ICD-10-CM | POA: Diagnosis not present

## 2020-08-23 DIAGNOSIS — E7800 Pure hypercholesterolemia, unspecified: Secondary | ICD-10-CM | POA: Diagnosis not present

## 2020-08-23 DIAGNOSIS — I10 Essential (primary) hypertension: Secondary | ICD-10-CM | POA: Diagnosis not present

## 2020-09-29 DIAGNOSIS — M7061 Trochanteric bursitis, right hip: Secondary | ICD-10-CM | POA: Diagnosis not present

## 2020-09-29 DIAGNOSIS — M545 Low back pain, unspecified: Secondary | ICD-10-CM | POA: Diagnosis not present

## 2020-10-06 DIAGNOSIS — M5441 Lumbago with sciatica, right side: Secondary | ICD-10-CM | POA: Diagnosis not present

## 2020-10-06 DIAGNOSIS — M9901 Segmental and somatic dysfunction of cervical region: Secondary | ICD-10-CM | POA: Diagnosis not present

## 2020-10-06 DIAGNOSIS — M9903 Segmental and somatic dysfunction of lumbar region: Secondary | ICD-10-CM | POA: Diagnosis not present

## 2020-10-06 DIAGNOSIS — M9902 Segmental and somatic dysfunction of thoracic region: Secondary | ICD-10-CM | POA: Diagnosis not present

## 2020-10-21 DIAGNOSIS — M5441 Lumbago with sciatica, right side: Secondary | ICD-10-CM | POA: Diagnosis not present

## 2020-10-21 DIAGNOSIS — M9902 Segmental and somatic dysfunction of thoracic region: Secondary | ICD-10-CM | POA: Diagnosis not present

## 2020-10-21 DIAGNOSIS — M9901 Segmental and somatic dysfunction of cervical region: Secondary | ICD-10-CM | POA: Diagnosis not present

## 2020-10-21 DIAGNOSIS — M9903 Segmental and somatic dysfunction of lumbar region: Secondary | ICD-10-CM | POA: Diagnosis not present

## 2021-01-05 DIAGNOSIS — M9903 Segmental and somatic dysfunction of lumbar region: Secondary | ICD-10-CM | POA: Diagnosis not present

## 2021-01-05 DIAGNOSIS — M5441 Lumbago with sciatica, right side: Secondary | ICD-10-CM | POA: Diagnosis not present

## 2021-01-05 DIAGNOSIS — M9902 Segmental and somatic dysfunction of thoracic region: Secondary | ICD-10-CM | POA: Diagnosis not present

## 2021-01-05 DIAGNOSIS — M9901 Segmental and somatic dysfunction of cervical region: Secondary | ICD-10-CM | POA: Diagnosis not present

## 2021-01-09 DIAGNOSIS — M9901 Segmental and somatic dysfunction of cervical region: Secondary | ICD-10-CM | POA: Diagnosis not present

## 2021-01-09 DIAGNOSIS — M9903 Segmental and somatic dysfunction of lumbar region: Secondary | ICD-10-CM | POA: Diagnosis not present

## 2021-01-09 DIAGNOSIS — M9902 Segmental and somatic dysfunction of thoracic region: Secondary | ICD-10-CM | POA: Diagnosis not present

## 2021-01-09 DIAGNOSIS — M5441 Lumbago with sciatica, right side: Secondary | ICD-10-CM | POA: Diagnosis not present

## 2021-01-11 DIAGNOSIS — M9903 Segmental and somatic dysfunction of lumbar region: Secondary | ICD-10-CM | POA: Diagnosis not present

## 2021-01-11 DIAGNOSIS — M9901 Segmental and somatic dysfunction of cervical region: Secondary | ICD-10-CM | POA: Diagnosis not present

## 2021-01-11 DIAGNOSIS — M9902 Segmental and somatic dysfunction of thoracic region: Secondary | ICD-10-CM | POA: Diagnosis not present

## 2021-01-11 DIAGNOSIS — M5441 Lumbago with sciatica, right side: Secondary | ICD-10-CM | POA: Diagnosis not present

## 2021-02-17 DIAGNOSIS — H04123 Dry eye syndrome of bilateral lacrimal glands: Secondary | ICD-10-CM | POA: Diagnosis not present

## 2021-02-17 DIAGNOSIS — H532 Diplopia: Secondary | ICD-10-CM | POA: Diagnosis not present

## 2021-02-17 DIAGNOSIS — H524 Presbyopia: Secondary | ICD-10-CM | POA: Diagnosis not present

## 2021-02-17 DIAGNOSIS — Z9889 Other specified postprocedural states: Secondary | ICD-10-CM | POA: Diagnosis not present

## 2021-02-20 DIAGNOSIS — I10 Essential (primary) hypertension: Secondary | ICD-10-CM | POA: Diagnosis not present

## 2021-02-20 DIAGNOSIS — G4700 Insomnia, unspecified: Secondary | ICD-10-CM | POA: Diagnosis not present

## 2021-02-20 DIAGNOSIS — L649 Androgenic alopecia, unspecified: Secondary | ICD-10-CM | POA: Diagnosis not present

## 2021-02-20 DIAGNOSIS — E7800 Pure hypercholesterolemia, unspecified: Secondary | ICD-10-CM | POA: Diagnosis not present

## 2021-03-06 DIAGNOSIS — Z23 Encounter for immunization: Secondary | ICD-10-CM | POA: Diagnosis not present

## 2021-03-13 DIAGNOSIS — H532 Diplopia: Secondary | ICD-10-CM | POA: Diagnosis not present

## 2021-03-13 DIAGNOSIS — H04123 Dry eye syndrome of bilateral lacrimal glands: Secondary | ICD-10-CM | POA: Diagnosis not present

## 2021-04-13 DIAGNOSIS — M9905 Segmental and somatic dysfunction of pelvic region: Secondary | ICD-10-CM | POA: Diagnosis not present

## 2021-04-13 DIAGNOSIS — M9901 Segmental and somatic dysfunction of cervical region: Secondary | ICD-10-CM | POA: Diagnosis not present

## 2021-04-13 DIAGNOSIS — M5386 Other specified dorsopathies, lumbar region: Secondary | ICD-10-CM | POA: Diagnosis not present

## 2021-04-13 DIAGNOSIS — M9903 Segmental and somatic dysfunction of lumbar region: Secondary | ICD-10-CM | POA: Diagnosis not present

## 2021-05-11 DIAGNOSIS — H532 Diplopia: Secondary | ICD-10-CM | POA: Diagnosis not present

## 2021-05-11 DIAGNOSIS — H04123 Dry eye syndrome of bilateral lacrimal glands: Secondary | ICD-10-CM | POA: Diagnosis not present

## 2021-05-11 DIAGNOSIS — H5010 Unspecified exotropia: Secondary | ICD-10-CM | POA: Diagnosis not present

## 2021-05-31 DIAGNOSIS — M9905 Segmental and somatic dysfunction of pelvic region: Secondary | ICD-10-CM | POA: Diagnosis not present

## 2021-05-31 DIAGNOSIS — M5386 Other specified dorsopathies, lumbar region: Secondary | ICD-10-CM | POA: Diagnosis not present

## 2021-05-31 DIAGNOSIS — M9903 Segmental and somatic dysfunction of lumbar region: Secondary | ICD-10-CM | POA: Diagnosis not present

## 2021-05-31 DIAGNOSIS — M9901 Segmental and somatic dysfunction of cervical region: Secondary | ICD-10-CM | POA: Diagnosis not present

## 2021-06-07 DIAGNOSIS — M9901 Segmental and somatic dysfunction of cervical region: Secondary | ICD-10-CM | POA: Diagnosis not present

## 2021-06-07 DIAGNOSIS — M9905 Segmental and somatic dysfunction of pelvic region: Secondary | ICD-10-CM | POA: Diagnosis not present

## 2021-06-07 DIAGNOSIS — M9903 Segmental and somatic dysfunction of lumbar region: Secondary | ICD-10-CM | POA: Diagnosis not present

## 2021-06-07 DIAGNOSIS — M5386 Other specified dorsopathies, lumbar region: Secondary | ICD-10-CM | POA: Diagnosis not present

## 2021-08-02 DIAGNOSIS — M9905 Segmental and somatic dysfunction of pelvic region: Secondary | ICD-10-CM | POA: Diagnosis not present

## 2021-08-02 DIAGNOSIS — M5386 Other specified dorsopathies, lumbar region: Secondary | ICD-10-CM | POA: Diagnosis not present

## 2021-08-02 DIAGNOSIS — H524 Presbyopia: Secondary | ICD-10-CM | POA: Diagnosis not present

## 2021-08-02 DIAGNOSIS — M9901 Segmental and somatic dysfunction of cervical region: Secondary | ICD-10-CM | POA: Diagnosis not present

## 2021-08-02 DIAGNOSIS — H04123 Dry eye syndrome of bilateral lacrimal glands: Secondary | ICD-10-CM | POA: Diagnosis not present

## 2021-08-02 DIAGNOSIS — H532 Diplopia: Secondary | ICD-10-CM | POA: Diagnosis not present

## 2021-08-02 DIAGNOSIS — M9903 Segmental and somatic dysfunction of lumbar region: Secondary | ICD-10-CM | POA: Diagnosis not present

## 2021-08-31 DIAGNOSIS — R21 Rash and other nonspecific skin eruption: Secondary | ICD-10-CM | POA: Diagnosis not present

## 2021-10-05 DIAGNOSIS — M5386 Other specified dorsopathies, lumbar region: Secondary | ICD-10-CM | POA: Diagnosis not present

## 2021-10-05 DIAGNOSIS — M9901 Segmental and somatic dysfunction of cervical region: Secondary | ICD-10-CM | POA: Diagnosis not present

## 2021-10-05 DIAGNOSIS — M9903 Segmental and somatic dysfunction of lumbar region: Secondary | ICD-10-CM | POA: Diagnosis not present

## 2021-10-05 DIAGNOSIS — M9905 Segmental and somatic dysfunction of pelvic region: Secondary | ICD-10-CM | POA: Diagnosis not present

## 2021-10-26 DIAGNOSIS — M25562 Pain in left knee: Secondary | ICD-10-CM | POA: Diagnosis not present

## 2021-11-16 DIAGNOSIS — M5386 Other specified dorsopathies, lumbar region: Secondary | ICD-10-CM | POA: Diagnosis not present

## 2021-11-16 DIAGNOSIS — M9901 Segmental and somatic dysfunction of cervical region: Secondary | ICD-10-CM | POA: Diagnosis not present

## 2021-11-16 DIAGNOSIS — M9905 Segmental and somatic dysfunction of pelvic region: Secondary | ICD-10-CM | POA: Diagnosis not present

## 2021-11-16 DIAGNOSIS — M9903 Segmental and somatic dysfunction of lumbar region: Secondary | ICD-10-CM | POA: Diagnosis not present

## 2022-01-12 IMAGING — CR DG CHEST 2V
2 series · 2 of 2 positions shown · non-contrast
Comparison: None.

CLINICAL DATA: Shortness of breath on exertion. Nausea and vomiting
for the past 2 days.

EXAM:
CHEST - 2 VIEW

[chest pa]
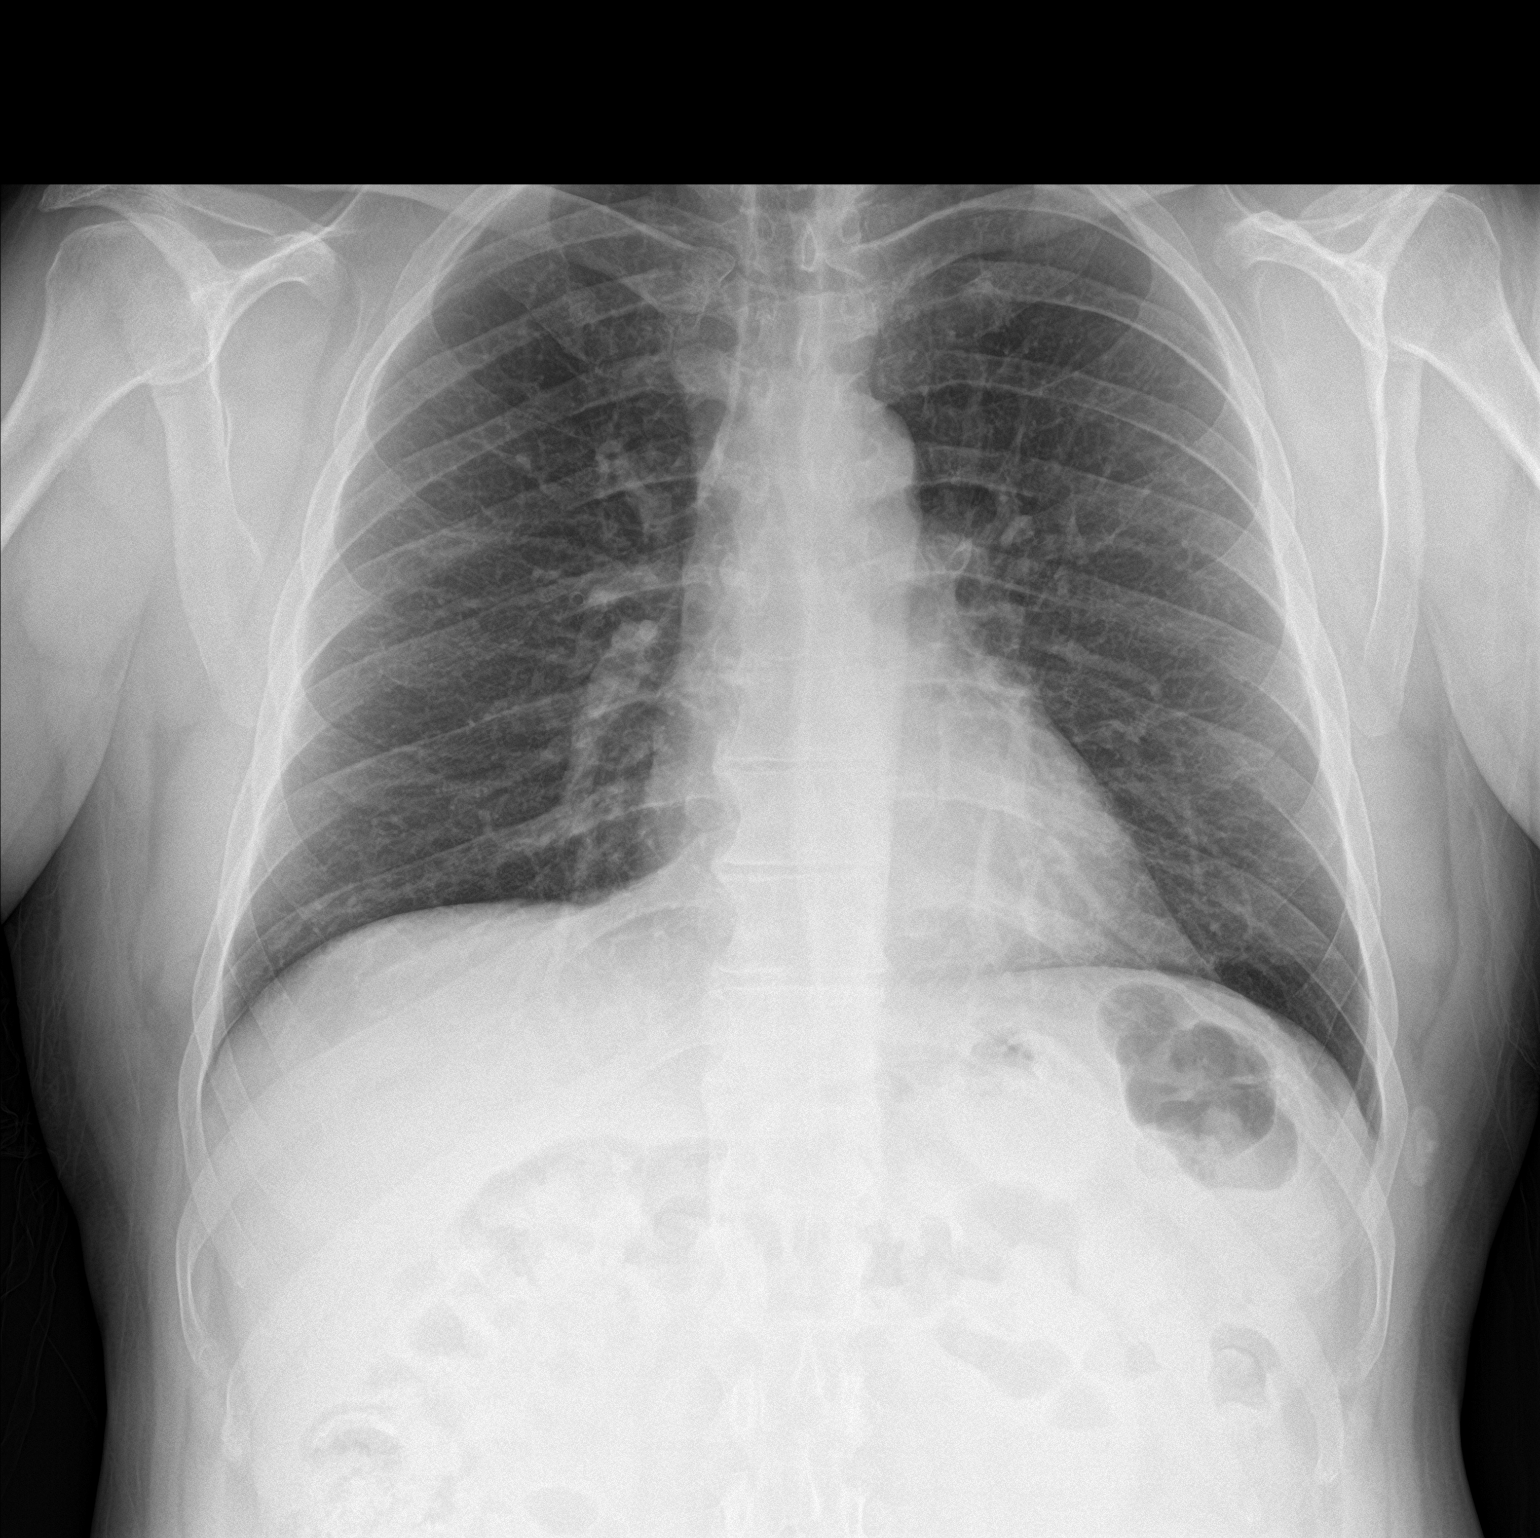

[chest lat]
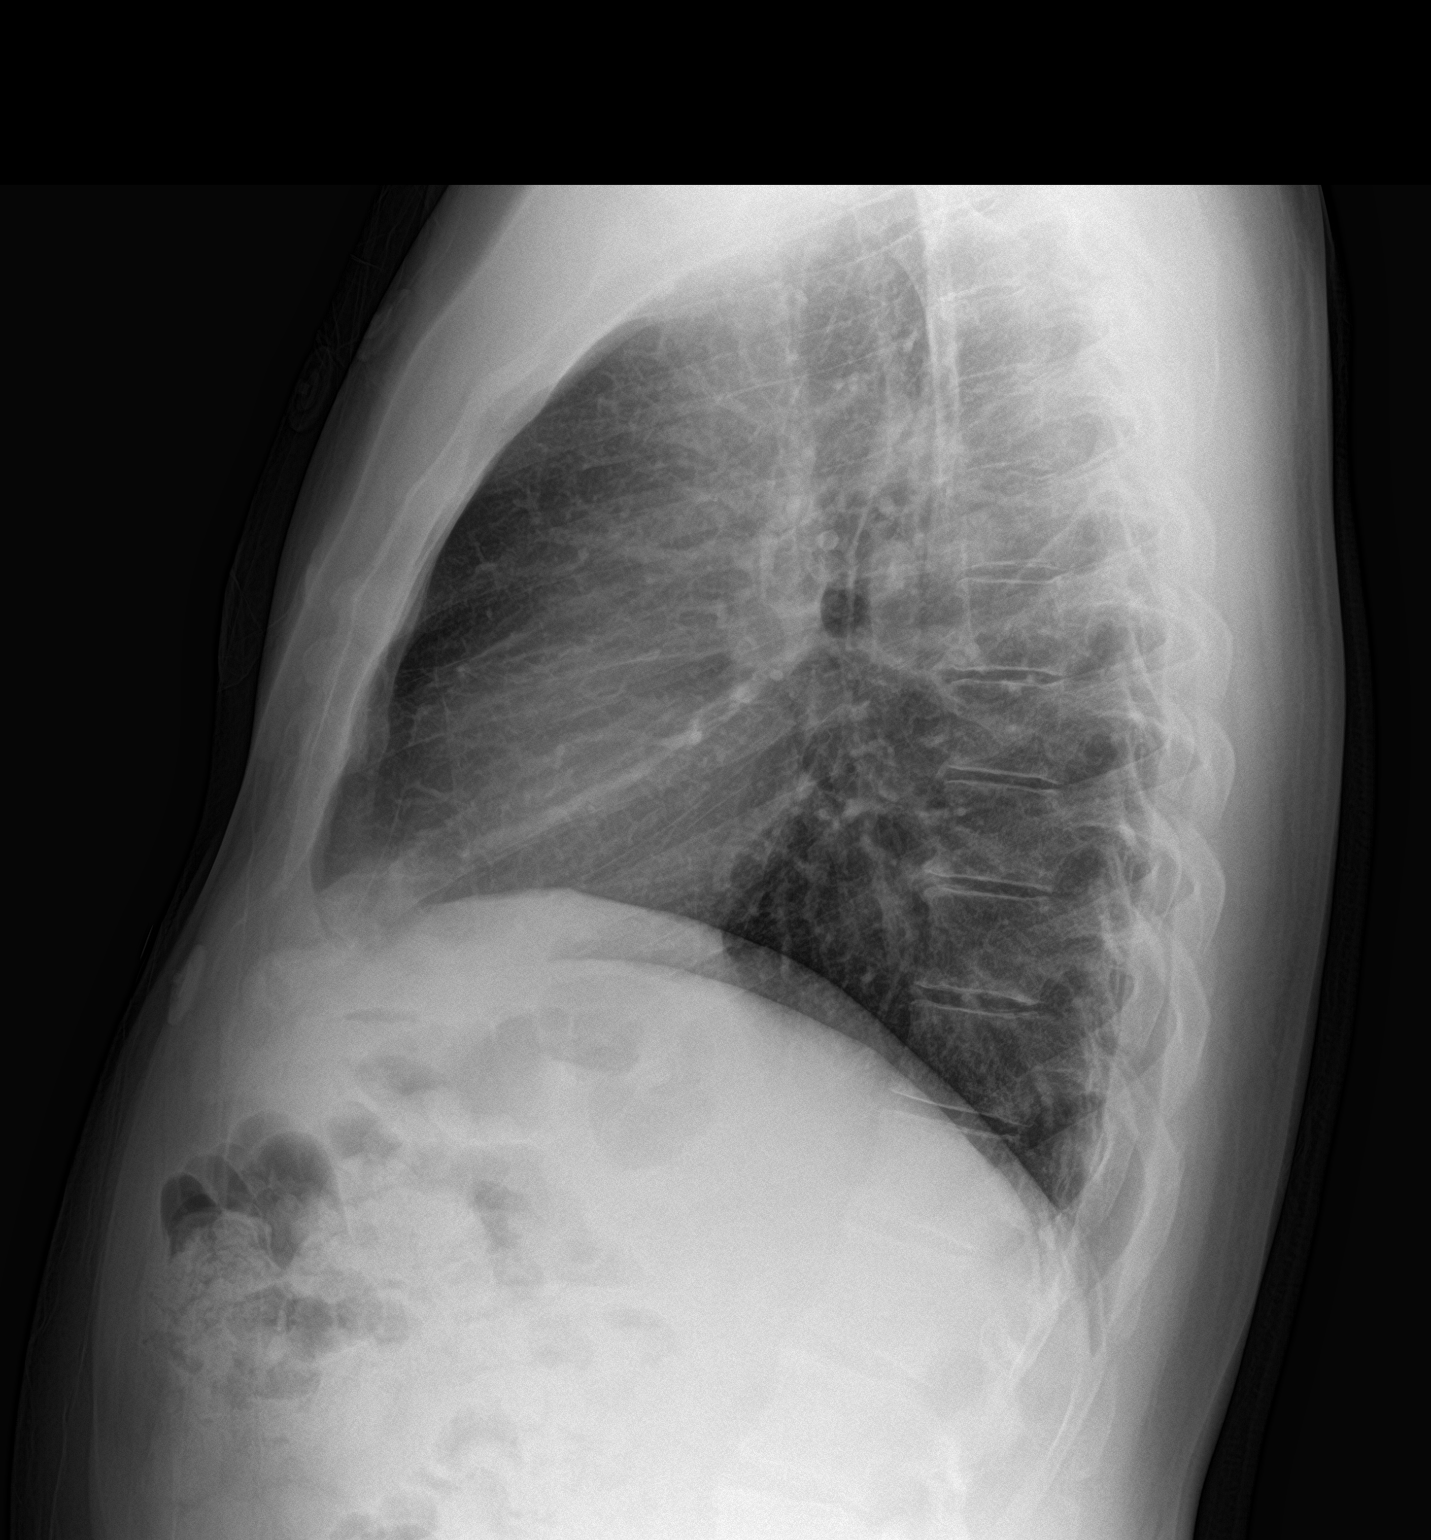

[2 of 2 positions shown; findings below may reference images not displayed]

FINDINGS: Normal sized heart. Clear lungs with normal vascularity. Mild
diffuse peribronchial thickening. Mild thoracic spine degenerative
changes.
IMPRESSION: Mild bronchitic changes.

## 2022-01-16 IMAGING — CT CT ANGIO CHEST
2 of 8 series · 19 of 46 positions shown · IV contrast (OMNIPAQUE 350)
Comparison: Chest radiograph October 01, 2019.

CLINICAL DATA: Short breath

EXAM:
CT ANGIOGRAPHY CHEST WITH CONTRAST
TECHNIQUE: Multidetector CT imaging of the chest was performed using the
standard protocol during bolus administration of intravenous
contrast. Multiplanar CT image reconstructions and MIPs were
obtained to evaluate the vascular anatomy.
CONTRAST:  80mL OMNIPAQUE IOHEXOL 350 MG/ML SOLN

[Series 5: thins · axial · 0.74mm/px · z∈[-320,-51]mm · 16 of 303 slices shown]
[im 17/303  lung]
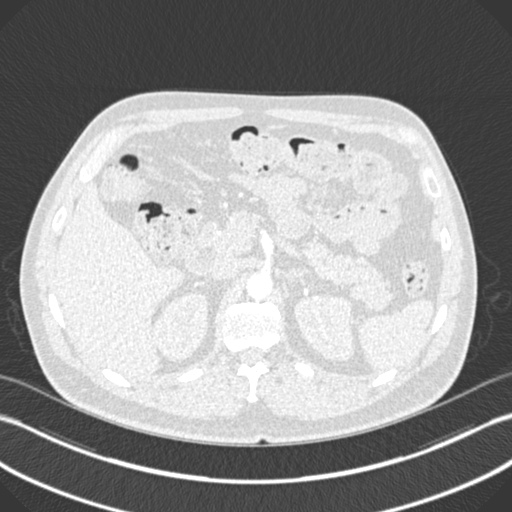
[im 34/303  soft-tissue]
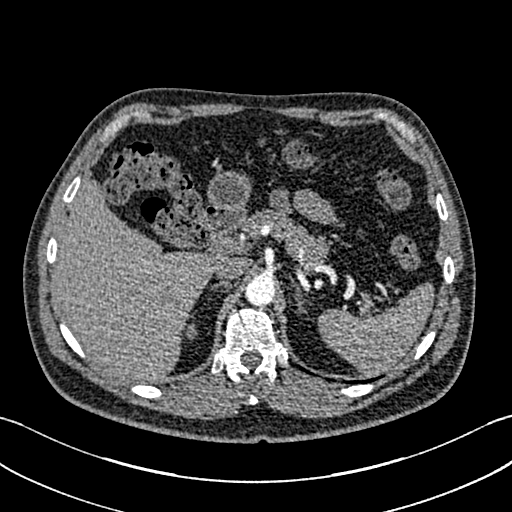
[im 51/303  lung]
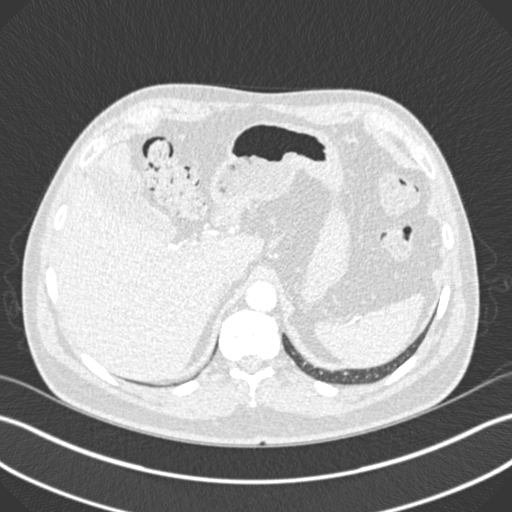
[im 68/303  soft-tissue]
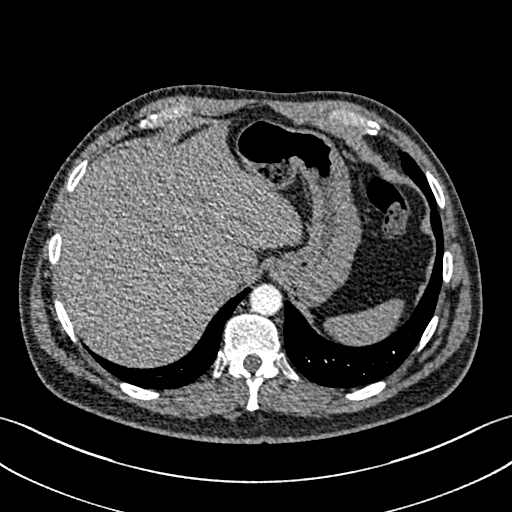
[im 84/303  lung]
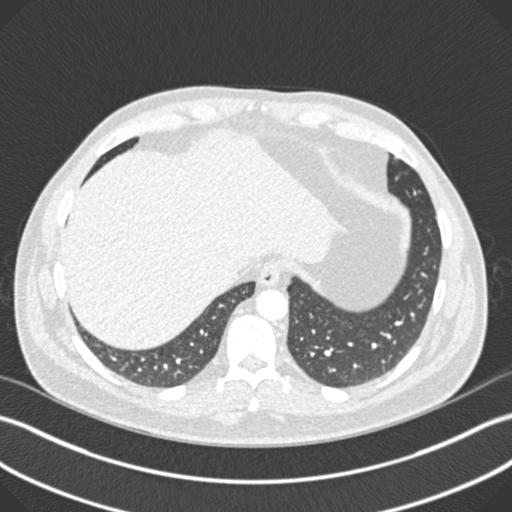
[im 101/303  soft-tissue]
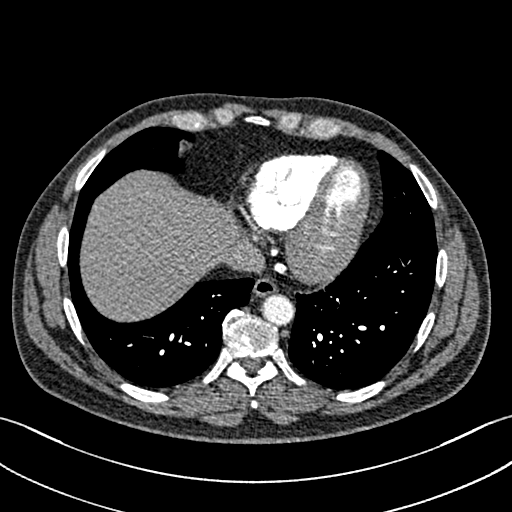
[im 118/303  lung]
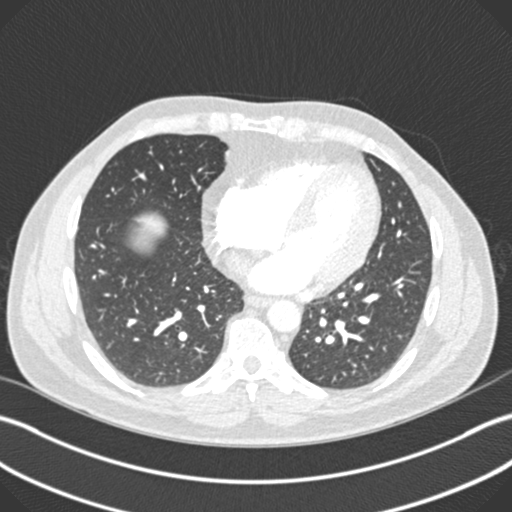
[im 135/303  soft-tissue]
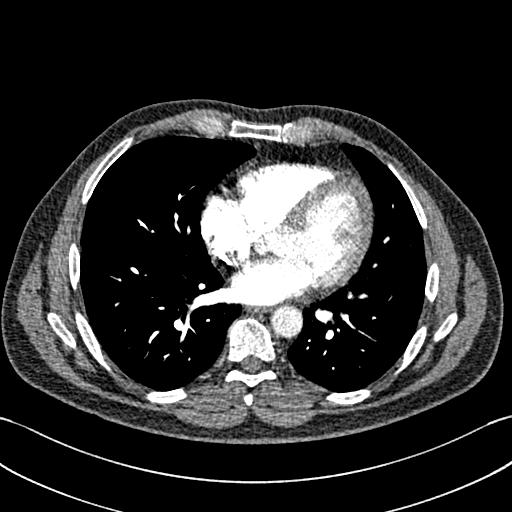
[im 168/303  lung]
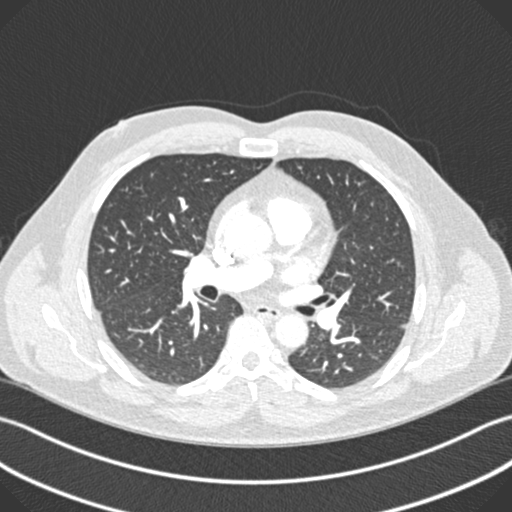
[im 185/303  soft-tissue]
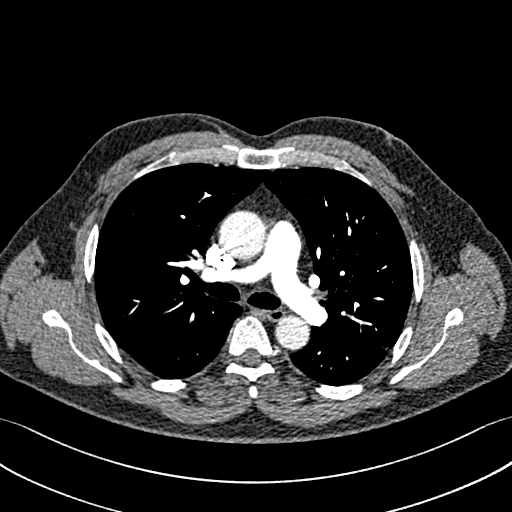
[im 202/303  lung]
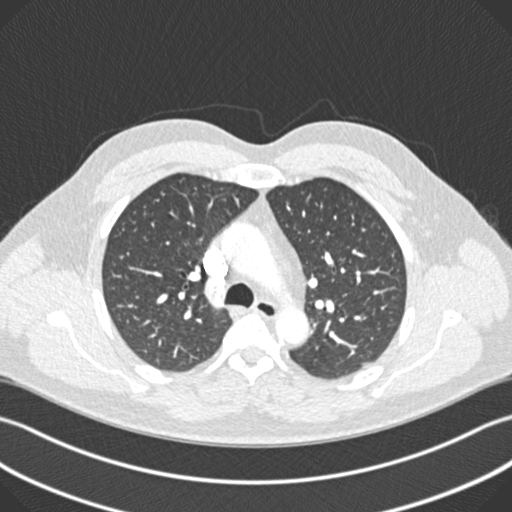
[im 219/303  soft-tissue]
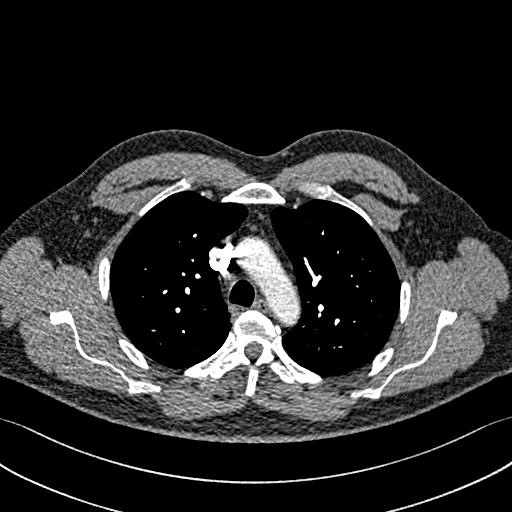
[im 235/303  lung]
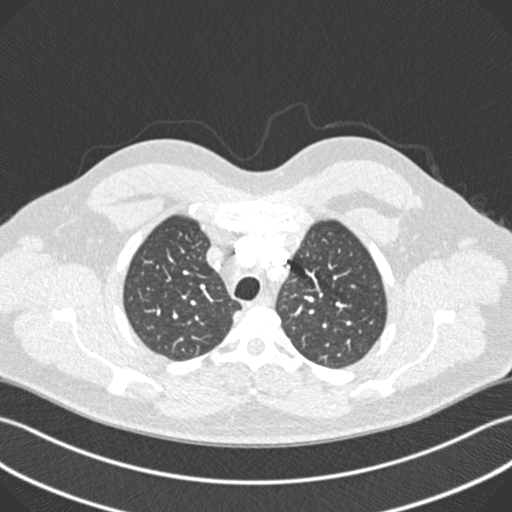
[im 252/303  soft-tissue]
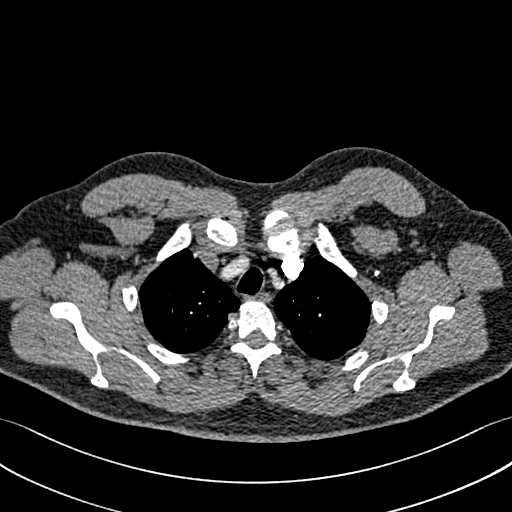
[im 269/303  lung]
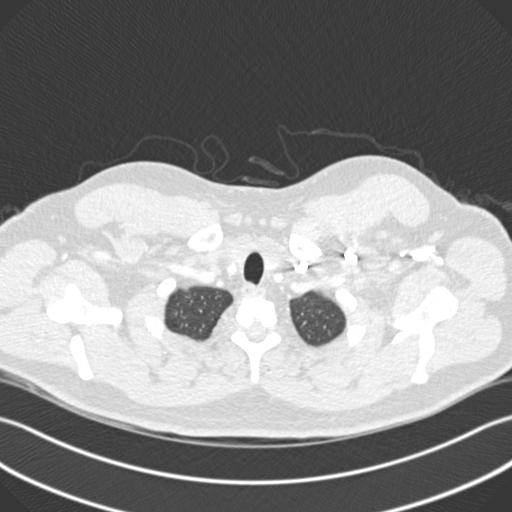
[im 286/303  soft-tissue]
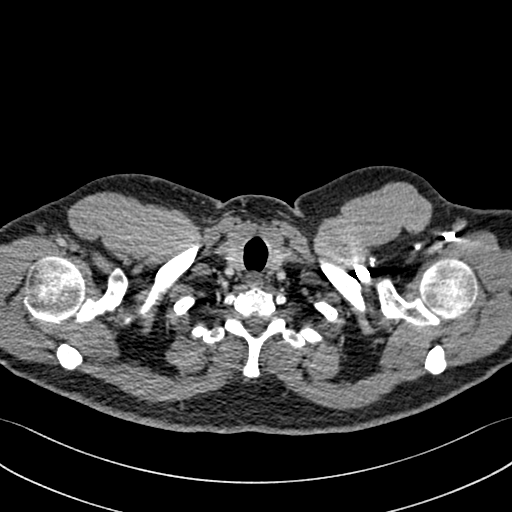

[Series 7: coronal mpr · coronal · 0.59mm/px · 3 of 122 slices shown]
[im 31/122  soft-tissue]
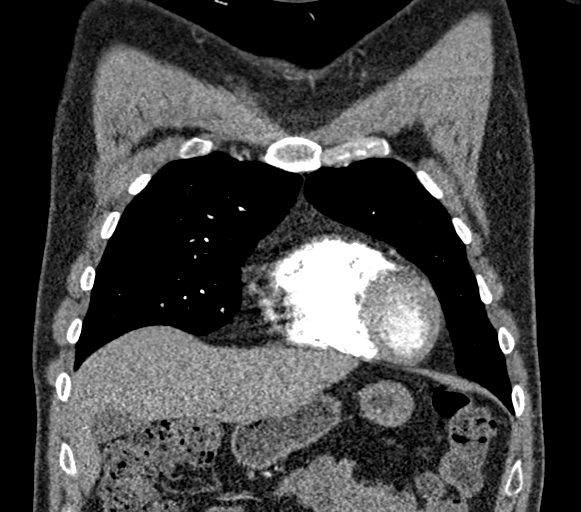
[im 61/122  soft-tissue]
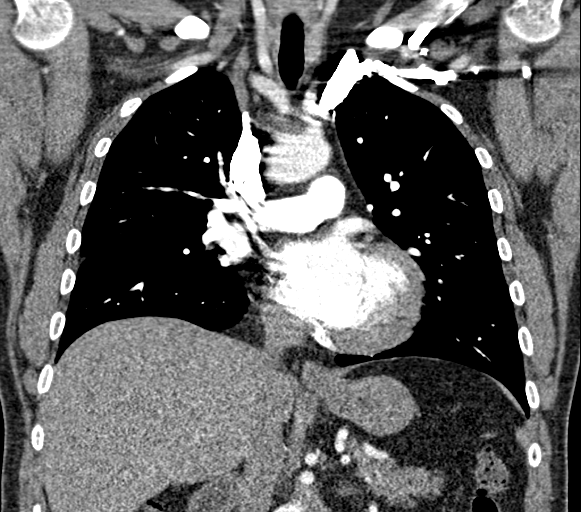
[im 91/122  soft-tissue]
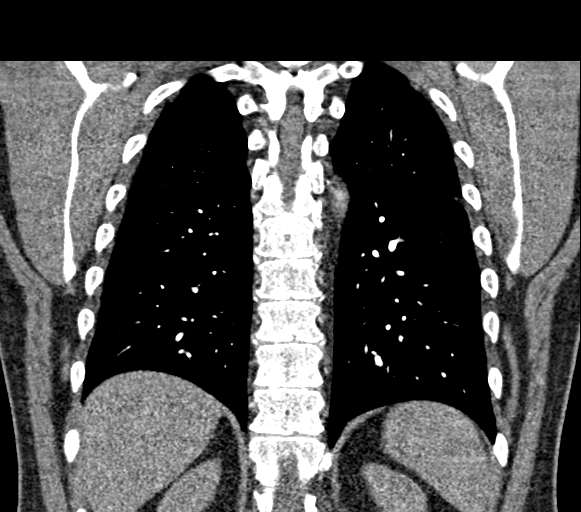

[19 of 46 positions shown; findings below may reference images not displayed]

FINDINGS: Cardiovascular: There is no demonstrable pulmonary embolus. There is
no thoracic aortic aneurysm or dissection. The visualized great
vessels appear normal. No pericardial effusion or pericardial
thickening evident.

Mediastinum/Nodes: Thyroid appears normal. There is no evident
adenopathy. No esophageal lesions are appreciable.

Lungs/Pleura: Lungs are clear.  No appreciable pleural effusions.

Upper Abdomen: Visualized upper abdominal structures appear
unremarkable.

Musculoskeletal: No blastic or lytic bone lesions. No chest wall
lesions evident.

Review of the MIP images confirms the above findings.
IMPRESSION: 1. No demonstrable pulmonary embolus. No thoracic aortic aneurysm or
dissection.

2.  Lungs clear.

3.  No evident thoracic adenopathy.

## 2022-01-25 DIAGNOSIS — M9903 Segmental and somatic dysfunction of lumbar region: Secondary | ICD-10-CM | POA: Diagnosis not present

## 2022-01-25 DIAGNOSIS — M9901 Segmental and somatic dysfunction of cervical region: Secondary | ICD-10-CM | POA: Diagnosis not present

## 2022-01-25 DIAGNOSIS — M5386 Other specified dorsopathies, lumbar region: Secondary | ICD-10-CM | POA: Diagnosis not present

## 2022-01-25 DIAGNOSIS — M9905 Segmental and somatic dysfunction of pelvic region: Secondary | ICD-10-CM | POA: Diagnosis not present

## 2022-02-01 DIAGNOSIS — H04123 Dry eye syndrome of bilateral lacrimal glands: Secondary | ICD-10-CM | POA: Diagnosis not present

## 2022-02-01 DIAGNOSIS — H524 Presbyopia: Secondary | ICD-10-CM | POA: Diagnosis not present

## 2022-02-20 DIAGNOSIS — M25561 Pain in right knee: Secondary | ICD-10-CM | POA: Diagnosis not present

## 2022-02-28 DIAGNOSIS — E7800 Pure hypercholesterolemia, unspecified: Secondary | ICD-10-CM | POA: Diagnosis not present

## 2022-02-28 DIAGNOSIS — I10 Essential (primary) hypertension: Secondary | ICD-10-CM | POA: Diagnosis not present

## 2022-02-28 DIAGNOSIS — L649 Androgenic alopecia, unspecified: Secondary | ICD-10-CM | POA: Diagnosis not present

## 2022-02-28 DIAGNOSIS — G4700 Insomnia, unspecified: Secondary | ICD-10-CM | POA: Diagnosis not present

## 2022-03-05 DIAGNOSIS — Z23 Encounter for immunization: Secondary | ICD-10-CM | POA: Diagnosis not present

## 2022-03-15 DIAGNOSIS — M9903 Segmental and somatic dysfunction of lumbar region: Secondary | ICD-10-CM | POA: Diagnosis not present

## 2022-03-15 DIAGNOSIS — M9901 Segmental and somatic dysfunction of cervical region: Secondary | ICD-10-CM | POA: Diagnosis not present

## 2022-03-15 DIAGNOSIS — M5386 Other specified dorsopathies, lumbar region: Secondary | ICD-10-CM | POA: Diagnosis not present

## 2022-03-15 DIAGNOSIS — M9905 Segmental and somatic dysfunction of pelvic region: Secondary | ICD-10-CM | POA: Diagnosis not present

## 2022-03-22 DIAGNOSIS — M25561 Pain in right knee: Secondary | ICD-10-CM | POA: Diagnosis not present

## 2022-03-27 DIAGNOSIS — S30861A Insect bite (nonvenomous) of abdominal wall, initial encounter: Secondary | ICD-10-CM | POA: Diagnosis not present

## 2022-04-05 DIAGNOSIS — M25561 Pain in right knee: Secondary | ICD-10-CM | POA: Diagnosis not present

## 2022-04-10 DIAGNOSIS — M25561 Pain in right knee: Secondary | ICD-10-CM | POA: Diagnosis not present

## 2022-04-27 DIAGNOSIS — M5386 Other specified dorsopathies, lumbar region: Secondary | ICD-10-CM | POA: Diagnosis not present

## 2022-04-27 DIAGNOSIS — M9901 Segmental and somatic dysfunction of cervical region: Secondary | ICD-10-CM | POA: Diagnosis not present

## 2022-04-27 DIAGNOSIS — M9905 Segmental and somatic dysfunction of pelvic region: Secondary | ICD-10-CM | POA: Diagnosis not present

## 2022-04-27 DIAGNOSIS — M9903 Segmental and somatic dysfunction of lumbar region: Secondary | ICD-10-CM | POA: Diagnosis not present

## 2022-06-25 DIAGNOSIS — M9903 Segmental and somatic dysfunction of lumbar region: Secondary | ICD-10-CM | POA: Diagnosis not present

## 2022-06-25 DIAGNOSIS — M9901 Segmental and somatic dysfunction of cervical region: Secondary | ICD-10-CM | POA: Diagnosis not present

## 2022-06-25 DIAGNOSIS — M5386 Other specified dorsopathies, lumbar region: Secondary | ICD-10-CM | POA: Diagnosis not present

## 2022-06-25 DIAGNOSIS — M9905 Segmental and somatic dysfunction of pelvic region: Secondary | ICD-10-CM | POA: Diagnosis not present

## 2022-06-28 DIAGNOSIS — M5386 Other specified dorsopathies, lumbar region: Secondary | ICD-10-CM | POA: Diagnosis not present

## 2022-06-28 DIAGNOSIS — M9903 Segmental and somatic dysfunction of lumbar region: Secondary | ICD-10-CM | POA: Diagnosis not present

## 2022-06-28 DIAGNOSIS — M9905 Segmental and somatic dysfunction of pelvic region: Secondary | ICD-10-CM | POA: Diagnosis not present

## 2022-06-28 DIAGNOSIS — M9901 Segmental and somatic dysfunction of cervical region: Secondary | ICD-10-CM | POA: Diagnosis not present

## 2022-07-31 DIAGNOSIS — M25561 Pain in right knee: Secondary | ICD-10-CM | POA: Diagnosis not present

## 2022-07-31 DIAGNOSIS — M67911 Unspecified disorder of synovium and tendon, right shoulder: Secondary | ICD-10-CM | POA: Diagnosis not present

## 2022-07-31 DIAGNOSIS — M25562 Pain in left knee: Secondary | ICD-10-CM | POA: Diagnosis not present

## 2022-08-04 DIAGNOSIS — B342 Coronavirus infection, unspecified: Secondary | ICD-10-CM | POA: Diagnosis not present

## 2022-08-22 DIAGNOSIS — M9903 Segmental and somatic dysfunction of lumbar region: Secondary | ICD-10-CM | POA: Diagnosis not present

## 2022-08-22 DIAGNOSIS — M9905 Segmental and somatic dysfunction of pelvic region: Secondary | ICD-10-CM | POA: Diagnosis not present

## 2022-08-22 DIAGNOSIS — M5386 Other specified dorsopathies, lumbar region: Secondary | ICD-10-CM | POA: Diagnosis not present

## 2022-08-22 DIAGNOSIS — M9901 Segmental and somatic dysfunction of cervical region: Secondary | ICD-10-CM | POA: Diagnosis not present

## 2022-10-30 DIAGNOSIS — H04123 Dry eye syndrome of bilateral lacrimal glands: Secondary | ICD-10-CM | POA: Diagnosis not present

## 2022-10-30 DIAGNOSIS — H524 Presbyopia: Secondary | ICD-10-CM | POA: Diagnosis not present

## 2022-10-30 DIAGNOSIS — H532 Diplopia: Secondary | ICD-10-CM | POA: Diagnosis not present

## 2022-10-30 DIAGNOSIS — H04121 Dry eye syndrome of right lacrimal gland: Secondary | ICD-10-CM | POA: Diagnosis not present

## 2022-10-30 DIAGNOSIS — H04122 Dry eye syndrome of left lacrimal gland: Secondary | ICD-10-CM | POA: Diagnosis not present

## 2022-10-30 DIAGNOSIS — H5010 Unspecified exotropia: Secondary | ICD-10-CM | POA: Diagnosis not present

## 2022-11-27 DIAGNOSIS — M9903 Segmental and somatic dysfunction of lumbar region: Secondary | ICD-10-CM | POA: Diagnosis not present

## 2022-11-27 DIAGNOSIS — M9901 Segmental and somatic dysfunction of cervical region: Secondary | ICD-10-CM | POA: Diagnosis not present

## 2022-11-27 DIAGNOSIS — M5386 Other specified dorsopathies, lumbar region: Secondary | ICD-10-CM | POA: Diagnosis not present

## 2022-11-27 DIAGNOSIS — M9905 Segmental and somatic dysfunction of pelvic region: Secondary | ICD-10-CM | POA: Diagnosis not present

## 2023-01-04 DIAGNOSIS — M9903 Segmental and somatic dysfunction of lumbar region: Secondary | ICD-10-CM | POA: Diagnosis not present

## 2023-01-04 DIAGNOSIS — M5386 Other specified dorsopathies, lumbar region: Secondary | ICD-10-CM | POA: Diagnosis not present

## 2023-01-04 DIAGNOSIS — M9901 Segmental and somatic dysfunction of cervical region: Secondary | ICD-10-CM | POA: Diagnosis not present

## 2023-01-04 DIAGNOSIS — M9905 Segmental and somatic dysfunction of pelvic region: Secondary | ICD-10-CM | POA: Diagnosis not present

## 2023-03-05 DIAGNOSIS — M9905 Segmental and somatic dysfunction of pelvic region: Secondary | ICD-10-CM | POA: Diagnosis not present

## 2023-03-05 DIAGNOSIS — M9903 Segmental and somatic dysfunction of lumbar region: Secondary | ICD-10-CM | POA: Diagnosis not present

## 2023-03-05 DIAGNOSIS — M9901 Segmental and somatic dysfunction of cervical region: Secondary | ICD-10-CM | POA: Diagnosis not present

## 2023-03-05 DIAGNOSIS — M5386 Other specified dorsopathies, lumbar region: Secondary | ICD-10-CM | POA: Diagnosis not present

## 2023-03-11 DIAGNOSIS — Z23 Encounter for immunization: Secondary | ICD-10-CM | POA: Diagnosis not present

## 2023-03-27 DIAGNOSIS — R739 Hyperglycemia, unspecified: Secondary | ICD-10-CM | POA: Diagnosis not present

## 2023-03-27 DIAGNOSIS — E7800 Pure hypercholesterolemia, unspecified: Secondary | ICD-10-CM | POA: Diagnosis not present

## 2023-03-27 DIAGNOSIS — L649 Androgenic alopecia, unspecified: Secondary | ICD-10-CM | POA: Diagnosis not present

## 2023-03-27 DIAGNOSIS — G4700 Insomnia, unspecified: Secondary | ICD-10-CM | POA: Diagnosis not present

## 2023-03-27 DIAGNOSIS — I10 Essential (primary) hypertension: Secondary | ICD-10-CM | POA: Diagnosis not present

## 2023-04-22 DIAGNOSIS — H04123 Dry eye syndrome of bilateral lacrimal glands: Secondary | ICD-10-CM | POA: Diagnosis not present

## 2023-04-23 DIAGNOSIS — M9903 Segmental and somatic dysfunction of lumbar region: Secondary | ICD-10-CM | POA: Diagnosis not present

## 2023-04-23 DIAGNOSIS — M9905 Segmental and somatic dysfunction of pelvic region: Secondary | ICD-10-CM | POA: Diagnosis not present

## 2023-04-23 DIAGNOSIS — M5386 Other specified dorsopathies, lumbar region: Secondary | ICD-10-CM | POA: Diagnosis not present

## 2023-04-23 DIAGNOSIS — M9901 Segmental and somatic dysfunction of cervical region: Secondary | ICD-10-CM | POA: Diagnosis not present

## 2023-05-01 DIAGNOSIS — M5386 Other specified dorsopathies, lumbar region: Secondary | ICD-10-CM | POA: Diagnosis not present

## 2023-05-01 DIAGNOSIS — M9903 Segmental and somatic dysfunction of lumbar region: Secondary | ICD-10-CM | POA: Diagnosis not present

## 2023-05-01 DIAGNOSIS — M9901 Segmental and somatic dysfunction of cervical region: Secondary | ICD-10-CM | POA: Diagnosis not present

## 2023-05-01 DIAGNOSIS — M9905 Segmental and somatic dysfunction of pelvic region: Secondary | ICD-10-CM | POA: Diagnosis not present

## 2023-05-25 DIAGNOSIS — M7062 Trochanteric bursitis, left hip: Secondary | ICD-10-CM | POA: Diagnosis not present

## 2023-07-04 DIAGNOSIS — M9901 Segmental and somatic dysfunction of cervical region: Secondary | ICD-10-CM | POA: Diagnosis not present

## 2023-07-04 DIAGNOSIS — M5386 Other specified dorsopathies, lumbar region: Secondary | ICD-10-CM | POA: Diagnosis not present

## 2023-07-04 DIAGNOSIS — M9903 Segmental and somatic dysfunction of lumbar region: Secondary | ICD-10-CM | POA: Diagnosis not present

## 2023-07-04 DIAGNOSIS — M9905 Segmental and somatic dysfunction of pelvic region: Secondary | ICD-10-CM | POA: Diagnosis not present

## 2023-07-06 DIAGNOSIS — L700 Acne vulgaris: Secondary | ICD-10-CM | POA: Diagnosis not present

## 2023-07-15 DIAGNOSIS — K645 Perianal venous thrombosis: Secondary | ICD-10-CM | POA: Diagnosis not present

## 2023-08-28 DIAGNOSIS — M9901 Segmental and somatic dysfunction of cervical region: Secondary | ICD-10-CM | POA: Diagnosis not present

## 2023-08-28 DIAGNOSIS — M9905 Segmental and somatic dysfunction of pelvic region: Secondary | ICD-10-CM | POA: Diagnosis not present

## 2023-08-28 DIAGNOSIS — M5386 Other specified dorsopathies, lumbar region: Secondary | ICD-10-CM | POA: Diagnosis not present

## 2023-08-28 DIAGNOSIS — M9903 Segmental and somatic dysfunction of lumbar region: Secondary | ICD-10-CM | POA: Diagnosis not present

## 2023-09-25 DIAGNOSIS — Z23 Encounter for immunization: Secondary | ICD-10-CM | POA: Diagnosis not present

## 2023-09-25 DIAGNOSIS — L649 Androgenic alopecia, unspecified: Secondary | ICD-10-CM | POA: Diagnosis not present

## 2023-09-25 DIAGNOSIS — G4700 Insomnia, unspecified: Secondary | ICD-10-CM | POA: Diagnosis not present

## 2023-09-25 DIAGNOSIS — E7800 Pure hypercholesterolemia, unspecified: Secondary | ICD-10-CM | POA: Diagnosis not present

## 2023-09-25 DIAGNOSIS — Z0000 Encounter for general adult medical examination without abnormal findings: Secondary | ICD-10-CM | POA: Diagnosis not present

## 2023-09-25 DIAGNOSIS — I10 Essential (primary) hypertension: Secondary | ICD-10-CM | POA: Diagnosis not present

## 2023-10-01 DIAGNOSIS — R509 Fever, unspecified: Secondary | ICD-10-CM | POA: Diagnosis not present

## 2023-10-03 DIAGNOSIS — M9903 Segmental and somatic dysfunction of lumbar region: Secondary | ICD-10-CM | POA: Diagnosis not present

## 2023-10-03 DIAGNOSIS — M5386 Other specified dorsopathies, lumbar region: Secondary | ICD-10-CM | POA: Diagnosis not present

## 2023-10-03 DIAGNOSIS — M9901 Segmental and somatic dysfunction of cervical region: Secondary | ICD-10-CM | POA: Diagnosis not present

## 2023-10-03 DIAGNOSIS — M9905 Segmental and somatic dysfunction of pelvic region: Secondary | ICD-10-CM | POA: Diagnosis not present

## 2023-11-04 DIAGNOSIS — H532 Diplopia: Secondary | ICD-10-CM | POA: Diagnosis not present

## 2023-11-04 DIAGNOSIS — H40013 Open angle with borderline findings, low risk, bilateral: Secondary | ICD-10-CM | POA: Diagnosis not present

## 2023-11-04 DIAGNOSIS — H04123 Dry eye syndrome of bilateral lacrimal glands: Secondary | ICD-10-CM | POA: Diagnosis not present

## 2023-11-04 DIAGNOSIS — H16223 Keratoconjunctivitis sicca, not specified as Sjogren's, bilateral: Secondary | ICD-10-CM | POA: Diagnosis not present

## 2023-11-05 DIAGNOSIS — M9905 Segmental and somatic dysfunction of pelvic region: Secondary | ICD-10-CM | POA: Diagnosis not present

## 2023-11-05 DIAGNOSIS — M9901 Segmental and somatic dysfunction of cervical region: Secondary | ICD-10-CM | POA: Diagnosis not present

## 2023-11-05 DIAGNOSIS — M9903 Segmental and somatic dysfunction of lumbar region: Secondary | ICD-10-CM | POA: Diagnosis not present

## 2023-11-05 DIAGNOSIS — M5386 Other specified dorsopathies, lumbar region: Secondary | ICD-10-CM | POA: Diagnosis not present

## 2024-02-08 DIAGNOSIS — L700 Acne vulgaris: Secondary | ICD-10-CM | POA: Diagnosis not present

## 2024-02-14 DIAGNOSIS — H40013 Open angle with borderline findings, low risk, bilateral: Secondary | ICD-10-CM | POA: Diagnosis not present

## 2024-02-21 DIAGNOSIS — M9903 Segmental and somatic dysfunction of lumbar region: Secondary | ICD-10-CM | POA: Diagnosis not present

## 2024-02-21 DIAGNOSIS — L700 Acne vulgaris: Secondary | ICD-10-CM | POA: Diagnosis not present

## 2024-02-21 DIAGNOSIS — M5386 Other specified dorsopathies, lumbar region: Secondary | ICD-10-CM | POA: Diagnosis not present

## 2024-02-21 DIAGNOSIS — L739 Follicular disorder, unspecified: Secondary | ICD-10-CM | POA: Diagnosis not present

## 2024-02-21 DIAGNOSIS — M9902 Segmental and somatic dysfunction of thoracic region: Secondary | ICD-10-CM | POA: Diagnosis not present

## 2024-02-21 DIAGNOSIS — M9901 Segmental and somatic dysfunction of cervical region: Secondary | ICD-10-CM | POA: Diagnosis not present

## 2024-02-21 DIAGNOSIS — M9905 Segmental and somatic dysfunction of pelvic region: Secondary | ICD-10-CM | POA: Diagnosis not present

## 2024-03-04 DIAGNOSIS — M9905 Segmental and somatic dysfunction of pelvic region: Secondary | ICD-10-CM | POA: Diagnosis not present

## 2024-03-04 DIAGNOSIS — M9901 Segmental and somatic dysfunction of cervical region: Secondary | ICD-10-CM | POA: Diagnosis not present

## 2024-03-04 DIAGNOSIS — M9903 Segmental and somatic dysfunction of lumbar region: Secondary | ICD-10-CM | POA: Diagnosis not present

## 2024-03-04 DIAGNOSIS — M5386 Other specified dorsopathies, lumbar region: Secondary | ICD-10-CM | POA: Diagnosis not present

## 2024-03-11 DIAGNOSIS — M5386 Other specified dorsopathies, lumbar region: Secondary | ICD-10-CM | POA: Diagnosis not present

## 2024-03-11 DIAGNOSIS — M9903 Segmental and somatic dysfunction of lumbar region: Secondary | ICD-10-CM | POA: Diagnosis not present

## 2024-03-11 DIAGNOSIS — M9905 Segmental and somatic dysfunction of pelvic region: Secondary | ICD-10-CM | POA: Diagnosis not present

## 2024-03-11 DIAGNOSIS — M9901 Segmental and somatic dysfunction of cervical region: Secondary | ICD-10-CM | POA: Diagnosis not present

## 2024-04-16 DIAGNOSIS — R051 Acute cough: Secondary | ICD-10-CM | POA: Diagnosis not present

## 2024-04-16 DIAGNOSIS — R0981 Nasal congestion: Secondary | ICD-10-CM | POA: Diagnosis not present

## 2024-04-28 DIAGNOSIS — R052 Subacute cough: Secondary | ICD-10-CM | POA: Diagnosis not present

## 2024-05-06 DIAGNOSIS — M5386 Other specified dorsopathies, lumbar region: Secondary | ICD-10-CM | POA: Diagnosis not present

## 2024-05-06 DIAGNOSIS — M9903 Segmental and somatic dysfunction of lumbar region: Secondary | ICD-10-CM | POA: Diagnosis not present

## 2024-05-06 DIAGNOSIS — M9905 Segmental and somatic dysfunction of pelvic region: Secondary | ICD-10-CM | POA: Diagnosis not present

## 2024-05-06 DIAGNOSIS — M9901 Segmental and somatic dysfunction of cervical region: Secondary | ICD-10-CM | POA: Diagnosis not present

## 2024-05-08 DIAGNOSIS — R0982 Postnasal drip: Secondary | ICD-10-CM | POA: Diagnosis not present

## 2024-05-08 DIAGNOSIS — R059 Cough, unspecified: Secondary | ICD-10-CM | POA: Diagnosis not present

## 2024-05-13 ENCOUNTER — Ambulatory Visit: Admitting: Pulmonary Disease

## 2024-05-13 ENCOUNTER — Encounter: Payer: Self-pay | Admitting: Pulmonary Disease

## 2024-05-13 VITALS — BP 128/82 | HR 74 | Temp 97.0°F | Ht 66.0 in | Wt 165.0 lb

## 2024-05-13 DIAGNOSIS — R052 Subacute cough: Secondary | ICD-10-CM

## 2024-05-13 MED ORDER — PREDNISONE 20 MG PO TABS: 40.0000 mg | ORAL_TABLET | Freq: Every day | ORAL | 0 refills | Status: AC

## 2024-05-13 MED ORDER — BENZONATATE 200 MG PO CAPS: 200.0000 mg | ORAL_CAPSULE | Freq: Three times a day (TID) | ORAL | 0 refills | Status: AC | PRN

## 2024-05-13 MED ORDER — HYDROCODONE BIT-HOMATROP MBR 5-1.5 MG/5ML PO SOLN: 5.0000 mL | Freq: Four times a day (QID) | ORAL | 0 refills | Status: DC | PRN

## 2024-05-13 MED ORDER — AMOXICILLIN-POT CLAVULANATE 875-125 MG PO TABS: 1.0000 | ORAL_TABLET | Freq: Two times a day (BID) | ORAL | 0 refills | Status: DC

## 2024-05-13 MED ORDER — ARNUITY ELLIPTA 200 MCG/ACT IN AEPB: 1.0000 | INHALATION_SPRAY | Freq: Every day | RESPIRATORY_TRACT | 1 refills | Status: AC

## 2024-05-13 NOTE — Progress Notes (Signed)
 Carlos Carpenter    [id number]    09/11/1970  Primary Care Physician:Miller, Olam, MD  Referring Physician: Dayna Motto, DO [address] [place],  [place] [address]  Chief complaint: Patient being seen for subacute cough  HPI:  Patient has had a cough for about 6 weeks Has used multiple courses of antibiotics, was recently put on tapering dose of steroids, gabapentin, benzonatate   Has used a course of Zithromax and doxycycline  Continues to have fullness and pressure around his nasal bones, cough with a sensation of something trapped at the back of his throat.  Valsartan has since been stopped, onset of symptoms coincided with when he started valsartan but symptoms have not significantly improved  Was given albuterol  and Arnuity  Does not have underlying lung disease No history of smoking   Outpatient Encounter Medications as of 05/13/2024  Medication Sig   albuterol  (VENTOLIN  HFA) 108 (90 Base) MCG/ACT inhaler Inhale 2 puffs into the lungs every 4 (four) hours as needed for wheezing or shortness of breath.   amLODipine (NORVASC) 5 MG tablet Take 5 mg by mouth daily.   amoxicillin -clavulanate (AUGMENTIN ) 875-125 MG tablet Take 1 tablet by mouth 2 (two) times daily.   benzonatate  (TESSALON ) 200 MG capsule Take 1 capsule (200 mg total) by mouth 3 (three) times daily as needed for cough.   cetirizine (ZYRTEC) 10 MG tablet Take 10 mg by mouth daily as needed for allergies.   finasteride (PROPECIA) 1 MG tablet Take 1 mg by mouth daily.   fluticasone (FLONASE) 50 MCG/ACT nasal spray Place 2 sprays into both nostrils daily as needed for allergies or rhinitis.   Fluticasone Furoate  (ARNUITY ELLIPTA ) 200 MCG/ACT AEPB Inhale 1 puff into the lungs daily.   gabapentin (NEURONTIN) 100 MG capsule Take 100 mg by mouth 3 (three) times daily.   hydrochlorothiazide (HYDRODIURIL) 25 MG tablet Take 25 mg by mouth daily.   HYDROcodone  bit-homatropine (HYDROMET) 5-1.5 MG/5ML syrup  Take 5 mLs by mouth every 6 (six) hours as needed for cough.   ibuprofen (ADVIL) 200 MG tablet Take 600 mg by mouth every 8 (eight) hours as needed for fever or moderate pain.   predniSONE  (DELTASONE ) 20 MG tablet Take 2 tablets (40 mg total) by mouth daily with breakfast for 5 days.   rosuvastatin (CRESTOR) 10 MG tablet Take 10 mg by mouth at bedtime.   VITAMIN D, CHOLECALCIFEROL, PO takes daily with Vit K2   zolpidem (AMBIEN) 10 MG tablet Take 10 mg by mouth at bedtime as needed for sleep.   [DISCONTINUED] ARNUITY ELLIPTA  100 MCG/ACT AEPB Inhale 1 puff into the lungs daily.   [DISCONTINUED] benzonatate  (TESSALON ) 100 MG capsule Take 100 mg by mouth 3 (three) times daily as needed for cough.   [DISCONTINUED] chlorpheniramine-HYDROcodone  (TUSSIONEX) 10-8 MG/5ML Take 5 mLs by mouth every 12 (twelve) hours as needed for cough.   [DISCONTINUED] predniSONE  (STERAPRED UNI-PAK 21 TAB) 10 MG (21) TBPK tablet Take 10 mg by mouth as directed.   No facility-administered encounter medications on file as of 05/13/2024.    Allergies as of 05/13/2024 - Review Complete 05/13/2024  Allergen Reaction Noted   Valsartan Cough 05/13/2024    Past Medical History:  Diagnosis Date   Hyperlipemia    Hypertension     Past Surgical History:  Procedure Laterality Date   NASAL SEPTUM SURGERY      Family History  Problem Relation Age of Onset   Hypertension Father  Hyperlipidemia Father    Heart disease Father    Pleurisy Brother    Hypertension Brother    Hyperlipidemia Brother     Social History   Socioeconomic History   Marital status: Divorced    Spouse name: Not on file   Number of children: 1   Years of education: Not on file   Highest education level: Not on file  Occupational History   Not on file  Tobacco Use   Smoking status: Never   Smokeless tobacco: Never  Vaping Use   Vaping status: Never Used  Substance and Sexual Activity   Alcohol use: Never   Drug use: Not Currently     Comment: College   Sexual activity: Not Currently  Other Topics Concern   Not on file  Social History Narrative   Not on file   Social Drivers of Health   Tobacco Use: Low Risk (05/13/2024)   Patient History    Smoking Tobacco Use: Never    Smokeless Tobacco Use: Never    Passive Exposure: Not on file  Financial Resource Strain: Not on file  Food Insecurity: Not on file  Transportation Needs: Not on file  Physical Activity: Not on file  Stress: Not on file  Social Connections: Not on file  Intimate Partner Violence: Not on file  Depression (EYV7-0): Not on file  Alcohol Screen: Not on file  Housing: Not on file  Utilities: Not on file  Health Literacy: Not on file    Review of Systems  Constitutional:  Positive for fatigue.  Respiratory:  Positive for cough and shortness of breath.     Vitals:   05/13/24 1512  BP: 128/82  Pulse: 74  Temp: (!) 97 F (36.1 C)  SpO2: 97%     Physical Exam Constitutional:      Appearance: Normal appearance.  HENT:     Head: Normocephalic.     Nose: Nose normal.     Mouth/Throat:     Mouth: Mucous membranes are moist.  Eyes:     General: No scleral icterus.    Pupils: Pupils are equal, round, and reactive to light.  Cardiovascular:     Rate and Rhythm: Normal rate and regular rhythm.     Heart sounds: No murmur heard.    No friction rub.  Pulmonary:     Effort: No respiratory distress.     Breath sounds: No stridor. No wheezing or rhonchi.  Musculoskeletal:     Cervical back: No rigidity or tenderness.  Neurological:     Mental Status: He is alert.  Psychiatric:        Mood and Affect: Mood normal.    Data Reviewed: PFT 11/24/2019-no obstruction, no significant bronchodilator response, no restriction, normal diffusing capacity  CT chest from 2021 within normal limits, no infiltrative process  Recent chest x-ray-reported by patient has negative  Assessment/Plan: Subacute cough  Symptoms still persistent and  affecting his quality of life at present  Will escalate treatment - Increase Arnuity to 200  Prednisone  40 mg daily for 5 days  Increase benzonatate  to 200 3 times daily  Add Hydromet  Course of Augmentin  for possible sinus infection  Follow-up in 6 weeks  He will continue his current prescription for Neurontin  Encouraged to give us  a call/message if not feeling better     Jennet Epley MD [facility] 05/13/2024, 5:06 PM  CC: Dayna Motto, DO

## 2024-05-13 NOTE — Patient Instructions (Signed)
 Prescription for antibiotic-Augmentin  for 7 days Increase the dose of benzonatate  to 200 3 times daily Called in a cough medicine called Hydromet  Increase steroids to 40 mg daily for 5 days  Increase the strength of the inhaler that you are using to the 200 dose from the 100  Follow-up in 6 weeks  Call us  with any significant concerns  It is reassuring that your recent x-ray was negative for any significant findings

## 2024-05-15 DIAGNOSIS — R053 Chronic cough: Secondary | ICD-10-CM | POA: Diagnosis not present

## 2024-05-15 DIAGNOSIS — R0982 Postnasal drip: Secondary | ICD-10-CM | POA: Diagnosis not present

## 2024-05-19 ENCOUNTER — Encounter: Payer: Self-pay | Admitting: Otolaryngology

## 2024-05-19 ENCOUNTER — Other Ambulatory Visit: Payer: Self-pay | Admitting: Otolaryngology

## 2024-05-19 DIAGNOSIS — R053 Chronic cough: Secondary | ICD-10-CM

## 2024-05-25 ENCOUNTER — Ambulatory Visit
Admission: RE | Admit: 2024-05-25 | Discharge: 2024-05-25 | Disposition: A | Source: Ambulatory Visit | Attending: Otolaryngology | Admitting: Otolaryngology

## 2024-05-25 DIAGNOSIS — J029 Acute pharyngitis, unspecified: Secondary | ICD-10-CM | POA: Diagnosis not present

## 2024-05-25 DIAGNOSIS — R053 Chronic cough: Secondary | ICD-10-CM

## 2024-05-25 MED ORDER — IOPAMIDOL (ISOVUE-300) INJECTION 61%
75.0000 mL | Freq: Once | INTRAVENOUS | Status: AC | PRN
  Administered 2024-05-25: 75 mL via INTRAVENOUS

## 2024-06-02 ENCOUNTER — Telehealth: Payer: Self-pay

## 2024-06-02 NOTE — Telephone Encounter (Signed)
 Copied from CRM [id number]. Topic: Clinical - Lab/Test Results >> Jun 02, 2024 11:25 AM Carlos Carpenter wrote: Reason for CRM: Patient is calling to request that a provider review the CT results ENT was supposed to send over and call him to advise next steps, as ENT states it is a pulmonary issue and they cannot assist. Please return call to patient.   Called and spoke to pt - confirmed that he was requesting for his chest CT to be reviewed. I informed pt that he has an upcoming appointment with Madelin Stank, NP on 06/26/24, at which the scan could be reviewed in detail.  Pt states that he is unable to wait until that appointment to hear about his scan, as he is in rough shape and is c/o an unproductive terrible cough for about 10 weeks now Pt denies fever, body aches, or chills. Routing to Dr. Neda, as pt has last seen him in 04/2024.   Dr. Neda, please advise CT scan. Thank you!

## 2024-06-05 ENCOUNTER — Ambulatory Visit: Admitting: Adult Health

## 2024-06-05 ENCOUNTER — Encounter: Payer: Self-pay | Admitting: Adult Health

## 2024-06-05 VITALS — BP 134/82 | HR 73 | Temp 98.7°F | Ht 66.0 in | Wt 165.0 lb

## 2024-06-05 DIAGNOSIS — R053 Chronic cough: Secondary | ICD-10-CM

## 2024-06-05 DIAGNOSIS — J4521 Mild intermittent asthma with (acute) exacerbation: Secondary | ICD-10-CM

## 2024-06-05 DIAGNOSIS — K219 Gastro-esophageal reflux disease without esophagitis: Secondary | ICD-10-CM

## 2024-06-05 DIAGNOSIS — J9811 Atelectasis: Secondary | ICD-10-CM

## 2024-06-05 DIAGNOSIS — J309 Allergic rhinitis, unspecified: Secondary | ICD-10-CM

## 2024-06-05 DIAGNOSIS — R052 Subacute cough: Secondary | ICD-10-CM

## 2024-06-05 DIAGNOSIS — J984 Other disorders of lung: Secondary | ICD-10-CM

## 2024-06-05 DIAGNOSIS — R9389 Abnormal findings on diagnostic imaging of other specified body structures: Secondary | ICD-10-CM | POA: Diagnosis not present

## 2024-06-05 DIAGNOSIS — R058 Other specified cough: Secondary | ICD-10-CM | POA: Diagnosis not present

## 2024-06-05 LAB — CBC WITH DIFFERENTIAL/PLATELET
Basophils Absolute: 0.1 K/uL (ref 0.0–0.1)
Basophils Relative: 0.7 % (ref 0.0–3.0)
Eosinophils Absolute: 0.1 K/uL (ref 0.0–0.7)
Eosinophils Relative: 0.6 % (ref 0.0–5.0)
HCT: 47.1 % (ref 39.0–52.0)
Hemoglobin: 15.5 g/dL (ref 13.0–17.0)
Lymphocytes Relative: 19 % (ref 12.0–46.0)
Lymphs Abs: 2 K/uL (ref 0.7–4.0)
MCHC: 32.9 g/dL (ref 30.0–36.0)
MCV: 82.9 fl (ref 78.0–100.0)
Monocytes Absolute: 0.6 K/uL (ref 0.1–1.0)
Monocytes Relative: 5.8 % (ref 3.0–12.0)
Neutro Abs: 7.9 K/uL — ABNORMAL HIGH (ref 1.4–7.7)
Neutrophils Relative %: 73.9 % (ref 43.0–77.0)
Platelets: 246 K/uL (ref 150.0–400.0)
RBC: 5.68 Mil/uL (ref 4.22–5.81)
RDW: 14.4 % (ref 11.5–15.5)
WBC: 10.7 K/uL — ABNORMAL HIGH (ref 4.0–10.5)

## 2024-06-05 LAB — BRAIN NATRIURETIC PEPTIDE: Pro B Natriuretic peptide (BNP): 15 pg/mL (ref 1.0–100.0)

## 2024-06-05 LAB — POCT EXHALED NITRIC OXIDE: FeNO level (ppb): 9

## 2024-06-05 NOTE — Progress Notes (Signed)
 "  @Patient  ID: Carlos Carpenter, male    DOB: [birth year], 54 y.o.   MRN: [id number]  Chief Complaint  Patient presents with   Medical Management of Chronic Issues    Cough/SOB f/u    Referring provider: Cleotilde Planas, MD  HPI: 54 yo male never smoker seen for consult for chronic cough on May 13, 2024    TEST/EVENTS : Reviewed 06/05/2024  CT chest May 25, 2024 patchy areas of atelectasis and scarring throughout the lungs bilaterally, no suspicious lung nodules  PFTs November 24, 2019 normal    Discussed the use of AI scribe software for clinical note transcription with the patient, who gave verbal consent to proceed.  History of Present Illness Carlos Carpenter is a 54 year old male who presents with a chronic cough persisting for almost three months.  He has experienced a persistent dry cough for nearly three months, initially attributing it to a change in his blood pressure medication(Diovan). The cough has persisted despite multiple treatments occurring primarily when talking or laughing, does not significantly interfere with his sleep. No preceding viral illness or acute respiratory infection. No heartburn, indigestion, or sinus drainage issues. He experiences minor epistaxis in the morning but no hemoptysis.  He underwent evaluation by an ENT specialist, including a laryngoscopy, . He was prescribed gabapentin, Tessalon  Perles, an albuterol  inhaler, and steroids, but these treatments did not provide relief. He discontinued these medications after developing strep throat, which was treated with antibiotics.  Patient was seen last visit and treated with Augmentin  and prednisone  currently, he is using Arnuity and albuterol  again, and he ensures to brush, rinse, and gargle after using Arnuity. He is also using over-the-counter cough drops and Flonase.  CT chest done on May 25, 2024 showed patchy areas of linear platelike atelectasis/scarring bilaterally.  No suspicious  consolidation or nodules.   No cardiac history, chest pain, palpitations, or leg swelling. He is currently on Norvasc for blood pressure and has discontinued Diovan and hydrochlorothiazide. He has a history of a reaction to the COVID vaccine, which previously caused a cough, but no other significant lung issues.    He works in [employer], diplomatic services operational officer code, and denies any chemical exposures or recent travel. No pets and no significant changes at home that could contribute to his symptoms. No history of asthma, though he reports wheezing and a sensation of tightness in his airways. He has never smoked. No autoimmune conditions such as psoriasis, rheumatoid arthritis, or lupus. He reports a rash on his chest, which he is unsure if it is related to prednisone  use.     Allergies[1]  Immunization History  Administered Date(s) Administered   Influenza Whole 03/07/2019   Moderna Sars-Covid-2 Vaccination 08/06/2019, 08/27/2019    Past Medical History:  Diagnosis Date   Hyperlipemia    Hypertension     Tobacco History: Tobacco Use History[2] Counseling given: Not Answered   Outpatient Medications Prior to Visit  Medication Sig Dispense Refill   albuterol  (VENTOLIN  HFA) 108 (90 Base) MCG/ACT inhaler Inhale 2 puffs into the lungs every 4 (four) hours as needed for wheezing or shortness of breath.     amLODipine (NORVASC) 5 MG tablet Take 5 mg by mouth daily.     finasteride (PROPECIA) 1 MG tablet Take 1 mg by mouth daily.     fluticasone (FLONASE) 50 MCG/ACT nasal spray Place 2 sprays into both nostrils daily as needed for allergies or rhinitis.     Fluticasone Furoate  (ARNUITY ELLIPTA ) 200  MCG/ACT AEPB Inhale 1 puff into the lungs daily. 30 each 1   ibuprofen (ADVIL) 200 MG tablet Take 600 mg by mouth every 8 (eight) hours as needed for fever or moderate pain.     rosuvastatin (CRESTOR) 10 MG tablet Take 10 mg by mouth at bedtime.     VITAMIN D, CHOLECALCIFEROL, PO takes daily with Vit K2     zolpidem  (AMBIEN) 10 MG tablet Take 10 mg by mouth at bedtime as needed for sleep.     benzonatate  (TESSALON ) 200 MG capsule Take 1 capsule (200 mg total) by mouth 3 (three) times daily as needed for cough. (Patient not taking: Reported on 06/05/2024) 60 capsule 0   cetirizine (ZYRTEC) 10 MG tablet Take 10 mg by mouth daily as needed for allergies. (Patient not taking: Reported on 06/05/2024)     cyclobenzaprine (FLEXERIL) 5 MG tablet Take 5 mg by mouth 3 (three) times daily as needed. (Patient not taking: Reported on 06/05/2024)     gabapentin (NEURONTIN) 100 MG capsule Take 100 mg by mouth 3 (three) times daily. (Patient not taking: Reported on 06/05/2024)     hydrochlorothiazide (HYDRODIURIL) 25 MG tablet Take 25 mg by mouth daily. (Patient not taking: Reported on 06/05/2024)     HYDROcodone  bit-homatropine (HYDROMET) 5-1.5 MG/5ML syrup Take 5 mLs by mouth every 6 (six) hours as needed for cough. (Patient not taking: Reported on 06/05/2024) 120 mL 0   amoxicillin -clavulanate (AUGMENTIN ) 875-125 MG tablet Take 1 tablet by mouth 2 (two) times daily. 14 tablet 0   No facility-administered medications prior to visit.     Review of Systems:   Constitutional:   No  weight loss, night sweats,  Fevers, chills, fatigue, or  lassitude.  HEENT:   No headaches,  Difficulty swallowing,  Tooth/dental problems, or  Sore throat,                No sneezing, itching, ear ache, nasal congestion, post nasal drip,   CV:  No chest pain,  Orthopnea, PND, swelling in lower extremities, anasarca, dizziness, palpitations, syncope.   GI  No heartburn, indigestion, abdominal pain, nausea, vomiting, diarrhea, change in bowel habits, loss of appetite, bloody stools.   Resp:   No chest wall deformity  Skin: no rash or lesions.  GU: no dysuria, change in color of urine, no urgency or frequency.  No flank pain, no hematuria   MS:  No joint pain or swelling.  No decreased range of motion.  No back pain.    Physical Exam  BP (!)  143/90   Pulse 73   Temp 98.7 F (37.1 C)   Ht 5' 6 (1.676 m) Comment: Per pt  Wt 165 lb (74.8 kg) Comment: per pt  SpO2 96% Comment: RA  BMI 26.63 kg/m   GEN: A/Ox3; pleasant , NAD, well nourished    HEENT:  [place]/AT,  NOSE-clear, THROAT-clear, no lesions, no postnasal drip or exudate noted.   NECK:  Supple w/ fair ROM; no JVD; normal carotid impulses w/o bruits; no thyromegaly or nodules palpated; no lymphadenopathy.    RESP  Clear  P & A; w/o, wheezes/ rales/ or rhonchi. no accessory muscle use, no dullness to percussion  CARD:  RRR, no m/r/g, no peripheral edema, pulses intact, no cyanosis or clubbing.  GI:   Soft & nt; nml bowel sounds; no organomegaly or masses detected.   Musco: Warm bil, no deformities or joint swelling noted.   Neuro: alert, no focal deficits noted.  Skin: Warm, pinpoint pimples along upper chest     Lab Results:Reviewed 06/05/2024   CBC   BMET   BNP   ProBNP No results found for: PROBNP  Imaging: CT CHEST W CONTRAST Result Date: 05/29/2024 CLINICAL DATA:  Chronic cough. EXAM: CT CHEST WITH CONTRAST TECHNIQUE: Multidetector CT imaging of the chest was performed during intravenous contrast administration. RADIATION DOSE REDUCTION: This exam was performed according to the departmental dose-optimization program which includes automated exposure control, adjustment of the mA and/or kV according to patient size and/or use of iterative reconstruction technique. CONTRAST:  75mL ISOVUE -300 IOPAMIDOL  (ISOVUE -300) INJECTION 61% COMPARISON:  CT angiography chest from 10/05/2019. FINDINGS: Cardiovascular: Normal cardiac size. No pericardial effusion. No aortic aneurysm. Mediastinum/Nodes: Visualized thyroid  gland appears grossly unremarkable. No solid / cystic mediastinal masses. The esophagus is nondistended precluding optimal assessment. No axillary, mediastinal or hilar lymphadenopathy by size criteria. Lungs/Pleura: The central tracheo-bronchial tree is  patent. There are patchy areas of linear, plate-like atelectasis and/or scarring throughout bilateral lungs. No mass or consolidation. No pleural effusion or pneumothorax. No suspicious lung nodules. Upper Abdomen: There is stable left adrenal adenoma measuring approximately 1.1 x 1.9 cm. Remaining visualized upper abdominal viscera within normal limits. Musculoskeletal: The visualized soft tissues of the chest wall are grossly unremarkable. No suspicious osseous lesions. There are mild multilevel degenerative changes in the visualized spine. IMPRESSION: 1. No lung mass, consolidation, pleural effusion or pneumothorax. No suspicious lung nodule. 2. Multiple other nonacute observations, as described above. Aortic Atherosclerosis (ICD10-I70.0). Electronically Signed   By: Ree Molt M.D.   On: 05/29/2024 11:45    Administration History     None          Latest Ref Rng & Units 11/24/2019   11:39 AM  PFT Results  FVC-Pre L 4.03   FVC-Predicted Pre % 91   FVC-Post L 4.07   FVC-Predicted Post % 91   Pre FEV1/FVC % % 83   Post FEV1/FCV % % 84   FEV1-Pre L 3.33   FEV1-Predicted Pre % 96   FEV1-Post L 3.43   DLCO uncorrected ml/min/mmHg 28.22   DLCO UNC% % 108   DLCO corrected ml/min/mmHg 26.88   DLCO COR %Predicted % 103   DLVA Predicted % 106   TLC L 5.47   TLC % Predicted % 88   RV % Predicted % 80     No results found for: NITRICOXIDE\  FENO 9 ppb  No results found for: NITRICOXIDE This result suggests low (<25) Type 2 (T2) airway inflammation indicating a low likelihood of active T2-driven airway inflammation; reduced probability of response to inhaled corticosteroids.        No data to display              Assessment & Plan:   Assessment and Plan Assessment & Plan Chronic cough (upper airway cough syndrome)   He has a chronic dry cough persisting for almost three months, likely due to upper airway cough syndrome. Previous treatments, including gabapentin,  Tessalon  Perles, albuterol , and antibiotics and steroids, were ineffective. The cough worsens with talking and laughing and is associated with throat clearing. No clear trigger is identified, but possible postnasal drip or reflux may contribute. A CT scan shows inflammation and atelectasis, likely due to chronic coughing. The differential includes mild intermittent asthma/reactive airways. FENO low/normal Ordered blood work with CBC, BMP and allergy  panel  Start Delsym twice a day, continue Tessalon  Perles 200 mg three times a day, start Pepcid twice  a day, start Zyrtec at bedtime, and start Flonase in the morning. Advised voice rest, sips of water, and avoiding mint products.  Continue on Arnuity daily  Will consider restarting gabapentin if no improvement in three weeks.  Lung inflammation and atelectasis   A CT scan shows bilateral inflammation and atelectatic areas, likely due to chronic coughing. No suspicious nodules or pneumonia. The differential includes acute bronchitis from chronic coughing. No evidence of pulmonary fibrosis. Ordered a breathing test for the next visit. Will consider a high-resolution CT chest if the cough persists.  Suspected gastroesophageal reflux contributing to cough   Possible gastroesophageal reflux may be contributing to the chronic cough. No current symptoms of heartburn or indigestion, but reflux may be irritating the airway due to chronic coughing. Start Pepcid twice a day and advised avoiding mint products.  Allergic rhinitis   Possible allergic rhinitis may be contributing to postnasal drip and cough. No recent changes in environment or allergens identified. Start Zyrtec at bedtime and Flonase in the morning.  Incidental findings on CT imaging.  Aortic atherosclerosis-advised patient to follow-up with primary care and see if additional workup is indicated.  Adrenal adenoma-discussed with primary care for ongoing management      Mikaele Stecher, NP 06/05/2024  I  spent   45 minutes dedicated to the care of this patient on the date of this encounter to include pre-visit review of records, face-to-face time with the patient discussing conditions above, post visit ordering of testing, clinical documentation with the electronic health record, making appropriate referrals as documented, and communicating necessary findings to members of the patients care team.      [1]  Allergies Allergen Reactions   Valsartan Cough    valsartan  [2]  Social History Tobacco Use  Smoking Status Never  Smokeless Tobacco Never   "

## 2024-06-05 NOTE — Patient Instructions (Signed)
 Labs today  Begin Delsym 2 tsp Twice daily for cough As needed   Begin Tessalon  Three times a day  for cough As needed   Begin Pepcid 20mg  Twice daily   Sips of water to soothe throat and avoid throat clearing and coughing  Begin Zyrtec 10mg  At bedtime   Begin Flonase nasal daily  Continue on Arnuity 1 puff every day , rinse well after use.  Follow up in 6 weeks with PFT and As needed  with Dr. Neda or Hania Cerone NP  Please contact office for sooner follow up if symptoms do not improve or worsen or seek emergency care

## 2024-06-05 NOTE — Addendum Note (Signed)
 Addended byBETHA JESSICA BOUCHARD S on: 06/05/2024 10:37 AM   Modules accepted: Orders

## 2024-06-11 LAB — RESPIRATORY ALLERGY PANEL REGION II W/ RFLX: ~~LOC~~
Allergen, A. alternata, m6: <0.1 kU/L
Allergen, Cedar tree, t12: <0.1 kU/L
Allergen, Comm Silver Birch, t9: <0.1 kU/L
Allergen, Cottonwood, t14: <0.1 kU/L
Allergen, D pternoyssinus,d7: <0.1 kU/L
Allergen, Mouse Urine Protein, e78: <0.1 kU/L
Allergen, Mulberry, t76: <0.1 kU/L
Allergen, Oak,t7: <0.1 kU/L
Allergen, P. notatum, m1: <0.1 kU/L
Aspergillus fumigatus, m3: <0.1 kU/L
Bermuda Grass: <0.1 kU/L
Box Elder IgE: <0.1 kU/L
CLADOSPORIUM HERBARUM (M2) IGE: <0.1 kU/L
COMMON RAGWEED (SHORT) (W1) IGE: <0.1 kU/L
Cat Dander: <0.1 kU/L
Class: 0
Class: 0
Class: 0
Class: 0
Class: 0
Class: 0
Class: 0
Class: 0
Class: 0
Class: 0
Class: 0
Class: 0
Class: 0
Class: 0
Class: 0
Class: 0
Class: 0
Class: 0
Class: 0
Class: 0
Class: 0
Class: 0
Class: 0
Class: 0
Cockroach: <0.1 kU/L
D. farinae: <0.1 kU/L
Dog Dander: <0.1 kU/L
Elm IgE: <0.1 kU/L
IgE (Immunoglobulin E), Serum: 20 kU/L
Johnson Grass: <0.1 kU/L
Pecan/Hickory Tree IgE: <0.1 kU/L
Rough Pigweed  IgE: <0.1 kU/L
Sheep Sorrel IgE: <0.1 kU/L
Timothy Grass: <0.1 kU/L

## 2024-06-11 LAB — INTERPRETATION:

## 2024-06-12 ENCOUNTER — Ambulatory Visit: Payer: Self-pay | Admitting: Adult Health

## 2024-06-19 ENCOUNTER — Other Ambulatory Visit: Payer: Self-pay | Admitting: Pulmonary Disease

## 2024-06-19 MED ORDER — HYDROCODONE BIT-HOMATROP MBR 5-1.5 MG/5ML PO SOLN: 5.0000 mL | Freq: Four times a day (QID) | ORAL | 0 refills | Status: AC | PRN

## 2024-06-26 ENCOUNTER — Ambulatory Visit: Admitting: Adult Health

## 2024-07-17 ENCOUNTER — Ambulatory Visit: Admitting: Adult Health

## 2024-07-17 ENCOUNTER — Encounter
